# Patient Record
Sex: Female | Born: 1980
Health system: Southern US, Community
[De-identification: ages and names within clinical notes are randomized; demographics above are authoritative.]

## PROBLEM LIST (undated history)

## (undated) DIAGNOSIS — D649 Anemia, unspecified: Secondary | ICD-10-CM

## (undated) DIAGNOSIS — K5090 Crohn's disease, unspecified, without complications: Secondary | ICD-10-CM

## (undated) DIAGNOSIS — L409 Psoriasis, unspecified: Secondary | ICD-10-CM

## (undated) DIAGNOSIS — L732 Hidradenitis suppurativa: Secondary | ICD-10-CM

## (undated) DIAGNOSIS — K529 Noninfective gastroenteritis and colitis, unspecified: Secondary | ICD-10-CM

## (undated) DIAGNOSIS — J42 Unspecified chronic bronchitis: Secondary | ICD-10-CM

## (undated) HISTORY — PX: ABDOMINAL SURGERY: SHX537

## (undated) HISTORY — PX: NEPHROSTOMY: SHX1014

## (undated) HISTORY — PX: ILEOSTOMY: SHX1783

## (undated) HISTORY — PX: OTHER SURGICAL HISTORY: SHX169

---

## 2006-06-07 HISTORY — PX: COLECTOMY WITH COLOSTOMY CREATION/HARTMANN PROCEDURE: SHX6598

## 2007-06-08 HISTORY — PX: ILEOSTOMY: SHX1783

## 2011-07-21 ENCOUNTER — Encounter (HOSPITAL_BASED_OUTPATIENT_CLINIC_OR_DEPARTMENT_OTHER): Payer: Self-pay | Admitting: Emergency Medicine

## 2011-07-21 ENCOUNTER — Emergency Department (HOSPITAL_BASED_OUTPATIENT_CLINIC_OR_DEPARTMENT_OTHER)
Admission: EM | Admit: 2011-07-21 | Discharge: 2011-07-21 | Disposition: A | Discharge status: Home / Self Care | Payer: Medicare Other | Attending: Emergency Medicine | Admitting: Emergency Medicine

## 2011-07-21 ENCOUNTER — Emergency Department (HOSPITAL_BASED_OUTPATIENT_CLINIC_OR_DEPARTMENT_OTHER): Payer: Medicare Other

## 2011-07-21 DIAGNOSIS — F17200 Nicotine dependence, unspecified, uncomplicated: Secondary | ICD-10-CM | POA: Insufficient documentation

## 2011-07-21 DIAGNOSIS — M25519 Pain in unspecified shoulder: Secondary | ICD-10-CM | POA: Insufficient documentation

## 2011-07-21 HISTORY — DX: Crohn's disease, unspecified, without complications: K50.90

## 2011-07-21 MED ORDER — OXYCODONE-ACETAMINOPHEN 5-325 MG PO TABS
2.0000 | ORAL_TABLET | Freq: Once | ORAL | Status: AC
  Administered 2011-07-21: 2 via ORAL
  Filled 2011-07-21: qty 2
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
## 2011-07-21 NOTE — Discharge Instructions (Signed)
Call Dr. Lazaro Arms office tomorrow for follow up.    Rotator Cuff Injury The rotator cuff is the collective set of muscles and tendons that make up the stabilizing unit of your shoulder. This unit holds in the ball of the humerus (upper arm bone) in the socket of the scapula (shoulder blade). Injuries to this stabilizing unit most commonly come from sports or activities that cause the arm to be moved repeatedly over the head. Examples of this include throwing, weight lifting, swimming, racquet sports, or an injury such as falling on your arm. Chronic (longstanding) irritation of this unit can cause inflammation (soreness), bursitis, and eventual damage to the tendons to the point of rupture (tear). An acute (sudden) injury of the rotator cuff can result in a partial or complete tear. You may need surgery with complete tears. Small or partial rotator cuff tears may be treated conservatively with temporary immobilization, exercises and rest. Physical therapy may be needed. HOME CARE INSTRUCTIONS   Apply ice to the injury for 15 to 20 minutes 3 to 4 times per day for the first 2 days. Put the ice in a plastic bag and place a towel between the bag of ice and your skin.   If you have a shoulder immobilizer (sling and straps), do not remove it for as long as directed by your caregiver or until you see a caregiver for a follow-up examination. If you need to remove it, move your arm as little as possible.   You may want to sleep on several pillows or in a recliner at night to lessen swelling and pain.   Only take over-the-counter or prescription medicines for pain, discomfort, or fever as directed by your caregiver.   Do simple hand squeezing exercises with a soft rubber ball to decrease hand swelling.  SEEK MEDICAL CARE IF:   Pain in your shoulder increases or new pain or numbness develops in your arm, hand, or fingers.   Your hand or fingers are colder than your other hand.  SEEK IMMEDIATE MEDICAL CARE  IF:   Your arm, hand, or fingers are numb or tingling.   Your arm, hand, or fingers are increasingly swollen and painful, or turn white or blue.  Document Released: 05/21/2000 Document Revised: 02/03/2011 Document Reviewed: 05/14/2008 [facility] Patient Information 2012 [place], [place].

## 2011-07-27 ENCOUNTER — Ambulatory Visit (INDEPENDENT_AMBULATORY_CARE_PROVIDER_SITE_OTHER): Payer: Medicare Other | Admitting: Family Medicine

## 2011-07-27 ENCOUNTER — Encounter: Payer: Self-pay | Admitting: Family Medicine

## 2011-07-27 VITALS — BP 98/69 | HR 77 | Temp 98.2°F | Ht 67.0 in | Wt 190.0 lb

## 2011-07-27 DIAGNOSIS — M25519 Pain in unspecified shoulder: Secondary | ICD-10-CM

## 2011-07-27 DIAGNOSIS — M25512 Pain in left shoulder: Secondary | ICD-10-CM

## 2011-07-27 MED ORDER — NITROGLYCERIN 0.2 MG/HR TD PT24: MEDICATED_PATCH | TRANSDERMAL | Status: DC

## 2011-07-27 NOTE — Patient Instructions (Signed)
You have rotator cuff impingement and tendinopathy. Try to avoid painful activities (overhead activities, lifting with extended arm) as much as possible. Aleve 1-2 tabs twice a day with food for pain and inflammation Nitro patches - place 1/4th of a patch outside shoulder (on skin) and change every day.   Vicodin every 6 hours as needed for severe pain (don't take tylenol while you're taking this) - no driving while on this. Subacromial injection may be beneficial to help with pain and to decrease inflammation - you've had two of these already, I don't think it would be a good idea to give you another one. Home exercise program as discussed with theraband and scapular stabilization exercises - these are very important for long term relief even if an injection was given. Start physical therapy IF you are allowed more than 3 visits a year. Follow up with me in 5-6 weeks. If not improving would consider referral for discussion of subacromial decompression, distal clavicle excision, debridement. It's possible the surgeon would repeat your MRI given the old one is over 15 years old.

## 2011-07-29 ENCOUNTER — Encounter: Payer: Self-pay | Admitting: Family Medicine

## 2011-07-29 DIAGNOSIS — M25512 Pain in left shoulder: Secondary | ICD-10-CM | POA: Insufficient documentation
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
## 2011-07-29 NOTE — Assessment & Plan Note (Signed)
Exam consistent with MRI from 3 years ago - doubt she has a new rotator cuff tear, labral pathology (no new injuries).  Has already gone through significant portions of conservative protocol for impingement - cortisone injections x 2, PT, nsaids.  Given home exericse program and will write for PT depending on how many visits she is alloted by her insurance.  Vicodin and aleve prn.  Trial nitro patches for chronic tendinopathy.  If not improving after 6 weeks would then refer to orthopedic surgeon - they may want to repeat her MRI before considering surgical intervention given this was over 3 years ago but I would defer this to them.

## 2011-08-09 DIAGNOSIS — L732 Hidradenitis suppurativa: Secondary | ICD-10-CM | POA: Insufficient documentation

## 2011-08-31 ENCOUNTER — Ambulatory Visit: Payer: Medicare Other | Admitting: Family Medicine

## 2011-09-09 ENCOUNTER — Ambulatory Visit: Payer: Medicare Other | Admitting: Family Medicine

## 2012-02-10 DIAGNOSIS — L409 Psoriasis, unspecified: Secondary | ICD-10-CM | POA: Insufficient documentation

## 2012-02-14 ENCOUNTER — Encounter (HOSPITAL_BASED_OUTPATIENT_CLINIC_OR_DEPARTMENT_OTHER): Payer: Self-pay | Admitting: *Deleted

## 2012-02-14 ENCOUNTER — Emergency Department (HOSPITAL_BASED_OUTPATIENT_CLINIC_OR_DEPARTMENT_OTHER)
Admission: EM | Admit: 2012-02-14 | Discharge: 2012-02-14 | Disposition: A | Discharge status: Home / Self Care | Payer: Medicare Other | Attending: Emergency Medicine | Admitting: Emergency Medicine

## 2012-02-14 DIAGNOSIS — Z8249 Family history of ischemic heart disease and other diseases of the circulatory system: Secondary | ICD-10-CM | POA: Insufficient documentation

## 2012-02-14 DIAGNOSIS — F17200 Nicotine dependence, unspecified, uncomplicated: Secondary | ICD-10-CM | POA: Insufficient documentation

## 2012-02-14 DIAGNOSIS — K6289 Other specified diseases of anus and rectum: Secondary | ICD-10-CM | POA: Insufficient documentation

## 2012-02-14 DIAGNOSIS — Z888 Allergy status to other drugs, medicaments and biological substances status: Secondary | ICD-10-CM | POA: Insufficient documentation

## 2012-02-14 DIAGNOSIS — Z8489 Family history of other specified conditions: Secondary | ICD-10-CM | POA: Insufficient documentation

## 2012-02-14 DIAGNOSIS — Z833 Family history of diabetes mellitus: Secondary | ICD-10-CM | POA: Insufficient documentation

## 2012-02-14 DIAGNOSIS — Z91018 Allergy to other foods: Secondary | ICD-10-CM | POA: Insufficient documentation

## 2012-02-14 LAB — CBC WITH DIFFERENTIAL/PLATELET
Basophils Absolute: 0 10*3/uL (ref 0.0–0.1)
Basophils Relative: 0 % (ref 0–1)
HCT: 37.5 % (ref 36.0–46.0)
Lymphocytes Relative: 27 % (ref 12–46)
MCHC: 34.4 g/dL (ref 30.0–36.0)
Monocytes Absolute: 0.8 10*3/uL (ref 0.1–1.0)
Neutro Abs: 5.7 10*3/uL (ref 1.7–7.7)
Neutrophils Relative %: 62 % (ref 43–77)
Platelets: 310 10*3/uL (ref 150–400)
RDW: 12 % (ref 11.5–15.5)
WBC: 9.2 10*3/uL (ref 4.0–10.5)

## 2012-02-14 LAB — BASIC METABOLIC PANEL
Chloride: 99 mEq/L (ref 96–112)
Creatinine, Ser: 0.6 mg/dL (ref 0.50–1.10)
GFR calc Af Amer: >90 mL/min (ref >90)
Sodium: 138 mEq/L (ref 135–145)

## 2012-02-14 MED ORDER — PREDNISONE 50 MG PO TABS: 50.0000 mg | ORAL_TABLET | Freq: Every day | ORAL | Status: DC

## 2012-02-14 MED ORDER — OXYCODONE-ACETAMINOPHEN 5-325 MG PO TABS: 1.0000 | ORAL_TABLET | ORAL | Status: AC | PRN

## 2012-02-14 MED ORDER — OXYCODONE-ACETAMINOPHEN 5-325 MG PO TABS
1.0000 | ORAL_TABLET | Freq: Once | ORAL | Status: AC
  Administered 2012-02-14: 1 via ORAL
  Filled 2012-02-14 (×2): qty 1

## 2012-02-14 NOTE — ED Notes (Signed)
Pt c/o rectal pain x 1 week

## 2012-02-14 NOTE — ED Provider Notes (Signed)
History   This chart was scribed for Michelle Booze, MD by Sofie Rower. The patient was seen in room MH10/MH10 and the patient's care was started at 3:55PM    CSN: [id number]  Arrival date & time 02/14/12  1437   First MD Initiated Contact with Patient 02/14/12 1555      Chief Complaint  Patient presents with  . Rectal Pain    (Consider location/radiation/quality/duration/timing/severity/associated sxs/prior treatment) The history is provided by the patient and a friend. No language interpreter was used.    Michelle Hill is a 31 y.o. female , with a hx of crohn's disease and ileostomy, who presents to the Emergency Department complaining of  sudden, progressively worsening, rectal pain, onset two weeks ago with associated symptoms of chills, sweats, and nausea. The pt reports she has been experiencing pain, in addition to drainage from her rectum for two weeks, which has progressively become worse, prompting her visit to [facility] today, 02/14/12. The pt describes her rectal drainage as a mucosal consistency, rating her rectal pain at 8/10 at present. Modifying factors include taking ibuprofen which does not provide relief of the rectal pain. The pt has a hx of psoriasis located in the hands, feet, and scalp.  The pt denies any blood present in her rectal drainage.   The pt is a current everyday smoker (smokes 1/2 pack per day), in addition to occasionally drinking alcohol.  PCP is Dr. Donnal Debar at [facility].    Past Medical History  Diagnosis Date  . Crohn's disease     Past Surgical History  Procedure Date  . Ileostomy     Family History  Problem Relation Age of Onset  . Hyperlipidemia Father   . Hypertension Father   . Diabetes Neg Hx   . Heart attack Neg Hx   . Sudden death Neg Hx     History  Substance Use Topics  . Smoking status: Current Everyday Smoker -- 0.5 packs/day    Types: Cigarettes  . Smokeless tobacco: Not on file  . Alcohol Use: No     occ     OB History    Grav Para Term Preterm Abortions TAB SAB Ect Mult Living                  Review of Systems  All other systems reviewed and are negative.    Allergies  Chocolate and Aspirin  Home Medications   Current Outpatient Rx  Name Route Sig Dispense Refill  . ADULT MULTIVITAMIN W/MINERALS CH Oral Take 1 tablet by mouth daily.      BP 106/74  Pulse 89  Temp 98.5 F (36.9 C)  Resp 16  Ht 5\' 7"  (1.702 m)  Wt 192 lb (87.091 kg)  BMI 30.07 kg/m2  SpO2 100%  LMP 01/24/2012  Physical Exam  Nursing note and vitals reviewed. Constitutional: She is oriented to person, place, and time. She appears well-developed and well-nourished.  HENT:  Head: Atraumatic.  Nose: Nose normal.  Eyes: Conjunctivae and EOM are normal. Pupils are equal, round, and reactive to light.  Neck: Normal range of motion. Neck supple.  Cardiovascular: Normal rate, regular rhythm and normal heart sounds.   Pulmonary/Chest: Effort normal and breath sounds normal.  Abdominal: Soft. Bowel sounds are normal.  Genitourinary:       Mild mucoid drainage, no mass or tenderness or bleeding.   Musculoskeletal: Normal range of motion.  Neurological: She is alert and oriented to person, place, and time.  Skin: Skin is warm and dry.       Erythema and scaling on palms and soles consistent with psoriasis.  Psychiatric: She has a normal mood and affect. Her behavior is normal.    ED Course  Procedures (including critical care time)  DIAGNOSTIC STUDIES: Oxygen Saturation is 100% on room air, normal by my interpretation.    COORDINATION OF CARE:    4:08PM- Rectal exam conducted. Chaperone Present.   4:09PM- Treatment plan discussed. Pt agrees with treatment.   Results for orders placed during the hospital encounter of 02/14/12  CBC WITH DIFFERENTIAL      Component Value Range   WBC 9.2  4.0 - 10.5 K/uL   RBC 3.88  3.87 - 5.11 MIL/uL   Hemoglobin 12.9  12.0 - 15.0 g/dL   HCT 16.1  09.6 - 04.5  %   MCV 96.6  78.0 - 100.0 fL   MCH 33.2  26.0 - 34.0 pg   MCHC 34.4  30.0 - 36.0 g/dL   RDW 40.9  81.1 - 91.4 %   Platelets 310  150 - 400 K/uL   Neutrophils Relative 62  43 - 77 %   Neutro Abs 5.7  1.7 - 7.7 K/uL   Lymphocytes Relative 27  12 - 46 %   Lymphs Abs 2.5  0.7 - 4.0 K/uL   Monocytes Relative 9  3 - 12 %   Monocytes Absolute 0.8  0.1 - 1.0 K/uL   Eosinophils Relative 2  0 - 5 %   Eosinophils Absolute 0.2  0.0 - 0.7 K/uL   Basophils Relative 0  0 - 1 %   Basophils Absolute 0.0  0.0 - 0.1 K/uL  BASIC METABOLIC PANEL      Component Value Range   Sodium 138  135 - 145 mEq/L   Potassium 4.2  3.5 - 5.1 mEq/L   Chloride 99  96 - 112 mEq/L   CO2 28  19 - 32 mEq/L   Glucose, Bld 98  70 - 99 mg/dL   BUN 15  6 - 23 mg/dL   Creatinine, Ser 7.82  0.50 - 1.10 mg/dL   Calcium 9.9  8.4 - 95.6 mg/dL   GFR calc non Af Amer >90  >90 mL/min   GFR calc Af Amer >90  >90 mL/min      1. Rectal pain       MDM  Rectal pain with mucoid drainage in patient with known Crohn disease and status post total colectomy. This probably represents a flareup of Crohn's disease in her rectal stump. Laboratory workup is unremarkable, but this is not unexpected with Crohn's disease. She will be placed on moderate dose steroids and referred back to her gastroenterologist for followup.      I personally performed the services described in this documentation, which was scribed in my presence. The recorded information has been reviewed and considered.      Michelle Booze, MD 02/14/12 [phone]

## 2012-04-09 ENCOUNTER — Emergency Department (HOSPITAL_BASED_OUTPATIENT_CLINIC_OR_DEPARTMENT_OTHER): Payer: Medicare Other

## 2012-04-09 ENCOUNTER — Encounter (HOSPITAL_BASED_OUTPATIENT_CLINIC_OR_DEPARTMENT_OTHER): Payer: Self-pay | Admitting: Emergency Medicine

## 2012-04-09 ENCOUNTER — Emergency Department (HOSPITAL_BASED_OUTPATIENT_CLINIC_OR_DEPARTMENT_OTHER)
Admission: EM | Admit: 2012-04-09 | Discharge: 2012-04-09 | Disposition: A | Discharge status: Home / Self Care | Payer: Medicare Other | Attending: Emergency Medicine | Admitting: Emergency Medicine

## 2012-04-09 DIAGNOSIS — Y929 Unspecified place or not applicable: Secondary | ICD-10-CM | POA: Insufficient documentation

## 2012-04-09 DIAGNOSIS — K5090 Crohn's disease, unspecified, without complications: Secondary | ICD-10-CM | POA: Insufficient documentation

## 2012-04-09 DIAGNOSIS — Y939 Activity, unspecified: Secondary | ICD-10-CM | POA: Insufficient documentation

## 2012-04-09 DIAGNOSIS — F17200 Nicotine dependence, unspecified, uncomplicated: Secondary | ICD-10-CM | POA: Insufficient documentation

## 2012-04-09 DIAGNOSIS — Z872 Personal history of diseases of the skin and subcutaneous tissue: Secondary | ICD-10-CM | POA: Insufficient documentation

## 2012-04-09 DIAGNOSIS — W19XXXA Unspecified fall, initial encounter: Secondary | ICD-10-CM | POA: Insufficient documentation

## 2012-04-09 DIAGNOSIS — Z79899 Other long term (current) drug therapy: Secondary | ICD-10-CM | POA: Insufficient documentation

## 2012-04-09 DIAGNOSIS — S93409A Sprain of unspecified ligament of unspecified ankle, initial encounter: Secondary | ICD-10-CM | POA: Insufficient documentation

## 2012-04-09 HISTORY — DX: Psoriasis, unspecified: L40.9

## 2012-04-09 MED ORDER — OXYCODONE-ACETAMINOPHEN 5-325 MG PO TABS
2.0000 | ORAL_TABLET | Freq: Once | ORAL | Status: AC
  Administered 2012-04-09: 2 via ORAL
  Filled 2012-04-09 (×2): qty 2

## 2012-04-09 MED ORDER — OXYCODONE-ACETAMINOPHEN 5-325 MG PO TABS: 2.0000 | ORAL_TABLET | ORAL | Status: DC | PRN

## 2012-04-09 NOTE — ED Notes (Signed)
Pt has her own crutches

## 2012-04-09 NOTE — ED Notes (Signed)
Pt fell Saturday and injured right ankle.  Pt has bruising and swelling.  Painful to touch, worse with ambulation.  Good CMS.

## 2012-04-09 NOTE — ED Provider Notes (Signed)
History   This chart was scribed for Michelle Bucco, MD by Albertha Ghee Rifaie. This patient was seen in room MH01/MH01 and the patient's care was started at 1720.   CSN: [id number]  Arrival date & time 04/09/12  1629   First MD Initiated Contact with Patient 04/09/12 1720      Chief Complaint  Patient presents with  . Ankle Injury    (Consider location/radiation/quality/duration/timing/severity/associated sxs/prior treatment) The history is provided by the patient. No language interpreter was used.    Michelle Hill is a 31 y.o. female who presents to the Emergency Department complaining of gradually worsening, constant, non-radiating, moderate to severe right ankle and foot pain onset one day ago. The pain is attributed to a fall. There is associated bruising and swelling to the ankle. The pain is worsened by applying pressure and with ambulation. Patient has taken ibuprofen for pain relief. Patient has a history of psoriasis to the right foot. She denies any other injuries, back pain, and neck pain. She also denies fever, chills, and nausea. Pt is a current everyday smoker and occasional alcohol user.   Past Medical History  Diagnosis Date  . Crohn's disease   . Psoriasis     Past Surgical History  Procedure Date  . Ileostomy     Family History  Problem Relation Age of Onset  . Hyperlipidemia Father   . Hypertension Father   . Diabetes Neg Hx   . Heart attack Neg Hx   . Sudden death Neg Hx     History  Substance Use Topics  . Smoking status: Current Every Day Smoker -- 0.5 packs/day    Types: Cigarettes  . Smokeless tobacco: Not on file  . Alcohol Use: No     Comment: occ    OB History    Grav Para Term Preterm Abortions TAB SAB Ect Mult Living                  Review of Systems  Constitutional: Negative for fever, chills, diaphoresis and fatigue.  HENT: Negative for congestion, rhinorrhea and sneezing.   Eyes: Negative.   Respiratory: Negative for  cough, chest tightness and shortness of breath.   Cardiovascular: Negative for chest pain and leg swelling.  Gastrointestinal: Negative for nausea, vomiting, abdominal pain, diarrhea and blood in stool.  Genitourinary: Negative for frequency, hematuria, flank pain and difficulty urinating.  Musculoskeletal: Positive for arthralgias. Negative for back pain.  Skin: Negative for rash.  Neurological: Negative for dizziness, speech difficulty, weakness, numbness and headaches.    Allergies  Chocolate and Aspirin  Home Medications   Current Outpatient Rx  Name  Route  Sig  Dispense  Refill  . INFLIXIMAB 100 MG IV SOLR   Intravenous   Inject 100 mg into the vein every 8 (eight) weeks.         . ADULT MULTIVITAMIN W/MINERALS CH   Oral   Take 1 tablet by mouth daily.         . OXYCODONE-ACETAMINOPHEN 5-325 MG PO TABS   Oral   Take 2 tablets by mouth every 4 (four) hours as needed for pain.   15 tablet   0     BP 114/82  Pulse 67  Temp 98.3 F (36.8 C) (Oral)  Resp 16  SpO2 96%  LMP 03/25/2012  Physical Exam  Constitutional: She is oriented to person, place, and time. She appears well-developed and well-nourished.  HENT:  Head: Normocephalic and atraumatic.  Neck: Normal range  of motion. Neck supple.       No pain in neck or back  Musculoskeletal: Normal range of motion. She exhibits tenderness.       Right ankle: tenderness. Lateral malleolus tenderness found. No proximal fibula tenderness found.       Right foot: She exhibits tenderness.       Ecchymosis along the lateral malleolus along lateral right foot.  Pain along lateral malleolus and 4th and 5th metatarsals. No pain to proximal fibula. Neurovascularly intact.   Neurological: She is alert and oriented to person, place, and time.    ED Course  Procedures (including critical care time)  DIAGNOSTIC STUDIES: Oxygen Saturation is 96% on room air, adequate by my interpretation.    COORDINATION OF CARE: 6:31  PMDiscussed treatment plan with pt at bedside and pt agreed to plan.     Dg Ankle Complete Right  04/09/2012  *RADIOLOGY REPORT*  Clinical Data: Larey Seat.  Left ankle pain.  RIGHT ANKLE - COMPLETE 3+ VIEW  Comparison: None  Findings: The ankle mortise is maintained.  No acute ankle fractures identified.  The visualized mid and hind foot bony structures are intact.  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  There is fairly significant soft tissue swelling around the ankle.  IMPRESSION: No acute fracture.   Original Report Authenticated By: Rudie Meyer, M.D.    Dg Foot Complete Right  04/09/2012  *RADIOLOGY REPORT*  Clinical Data: Right foot injury.  RIGHT FOOT COMPLETE - 3+ VIEW  Comparison: None  Findings: The joint spaces are maintained.  No acute fracture.  IMPRESSION: No acute bony findings.   Original Report Authenticated By: Rudie Meyer, M.D.       1. Ankle sprain       MDM  No fracture noted.  Pt placed in air splint, has crutches.  Advised ice/elevation.  rx for percocet.  Advised to f/u with ortho if symptoms not improved.      I personally performed the services described in this documentation, which was scribed in my presence.  The recorded information has been reviewed and considered.    Michelle Bucco, MD 04/09/12 [phone]

## 2012-04-09 NOTE — ED Notes (Signed)
Ice pack given to the patient for her ankle.

## 2012-09-20 ENCOUNTER — Emergency Department (HOSPITAL_BASED_OUTPATIENT_CLINIC_OR_DEPARTMENT_OTHER): Payer: Medicare Other

## 2012-09-20 ENCOUNTER — Encounter (HOSPITAL_BASED_OUTPATIENT_CLINIC_OR_DEPARTMENT_OTHER): Payer: Self-pay | Admitting: *Deleted

## 2012-09-20 ENCOUNTER — Emergency Department (HOSPITAL_BASED_OUTPATIENT_CLINIC_OR_DEPARTMENT_OTHER)
Admission: EM | Admit: 2012-09-20 | Discharge: 2012-09-20 | Disposition: A | Discharge status: Home / Self Care | Payer: Medicare Other | Attending: Emergency Medicine | Admitting: Emergency Medicine

## 2012-09-20 DIAGNOSIS — F17200 Nicotine dependence, unspecified, uncomplicated: Secondary | ICD-10-CM | POA: Insufficient documentation

## 2012-09-20 DIAGNOSIS — R509 Fever, unspecified: Secondary | ICD-10-CM

## 2012-09-20 DIAGNOSIS — IMO0001 Reserved for inherently not codable concepts without codable children: Secondary | ICD-10-CM | POA: Insufficient documentation

## 2012-09-20 DIAGNOSIS — Z79899 Other long term (current) drug therapy: Secondary | ICD-10-CM | POA: Insufficient documentation

## 2012-09-20 DIAGNOSIS — K5090 Crohn's disease, unspecified, without complications: Secondary | ICD-10-CM | POA: Insufficient documentation

## 2012-09-20 DIAGNOSIS — Z932 Ileostomy status: Secondary | ICD-10-CM | POA: Insufficient documentation

## 2012-09-20 DIAGNOSIS — Z3202 Encounter for pregnancy test, result negative: Secondary | ICD-10-CM | POA: Insufficient documentation

## 2012-09-20 DIAGNOSIS — M791 Myalgia, unspecified site: Secondary | ICD-10-CM

## 2012-09-20 LAB — CBC WITH DIFFERENTIAL/PLATELET
Basophils Absolute: 0 10*3/uL (ref 0.0–0.1)
Basophils Relative: 0 % (ref 0–1)
Eosinophils Absolute: 0.1 10*3/uL (ref 0.0–0.7)
Eosinophils Relative: 1 % (ref 0–5)
HCT: 39.6 % (ref 36.0–46.0)
Hemoglobin: 13.6 g/dL (ref 12.0–15.0)
Lymphocytes Relative: 7 % — ABNORMAL LOW (ref 12–46)
Lymphs Abs: 1.4 10*3/uL (ref 0.7–4.0)
MCH: 33.7 pg (ref 26.0–34.0)
MCHC: 34.3 g/dL (ref 30.0–36.0)
MCV: 98 fL (ref 78.0–100.0)
Monocytes Absolute: 1 10*3/uL (ref 0.1–1.0)
Monocytes Relative: 5 % (ref 3–12)
Neutro Abs: 18 10*3/uL — ABNORMAL HIGH (ref 1.7–7.7)
Neutrophils Relative %: 88 % — ABNORMAL HIGH (ref 43–77)
Platelets: 328 10*3/uL (ref 150–400)
RBC: 4.04 MIL/uL (ref 3.87–5.11)
RDW: 14 % (ref 11.5–15.5)
WBC: 20.5 10*3/uL — ABNORMAL HIGH (ref 4.0–10.5)

## 2012-09-20 LAB — URINE MICROSCOPIC-ADD ON

## 2012-09-20 LAB — BASIC METABOLIC PANEL
BUN: 11 mg/dL (ref 6–23)
CO2: 24 mEq/L (ref 19–32)
Calcium: 10.1 mg/dL (ref 8.4–10.5)
Chloride: 103 mEq/L (ref 96–112)
Creatinine, Ser: 0.6 mg/dL (ref 0.50–1.10)
GFR calc Af Amer: >90 mL/min (ref >90)
GFR calc non Af Amer: >90 mL/min (ref >90)
Glucose, Bld: 101 mg/dL — ABNORMAL HIGH (ref 70–99)
Potassium: 3.6 mEq/L (ref 3.5–5.1)
Sodium: 138 mEq/L (ref 135–145)

## 2012-09-20 LAB — URINALYSIS, ROUTINE W REFLEX MICROSCOPIC
Bilirubin Urine: NEGATIVE
Glucose, UA: NEGATIVE mg/dL
Ketones, ur: NEGATIVE mg/dL
Leukocytes, UA: NEGATIVE
Nitrite: NEGATIVE
Protein, ur: NEGATIVE mg/dL
Specific Gravity, Urine: 1.028 (ref 1.005–1.030)
Urobilinogen, UA: 0.2 mg/dL (ref 0.0–1.0)
pH: 6 (ref 5.0–8.0)

## 2012-09-20 LAB — PREGNANCY, URINE: Preg Test, Ur: NEGATIVE

## 2012-09-20 MED ORDER — SODIUM CHLORIDE 0.9 % IV BOLUS (SEPSIS)
1000.0000 mL | Freq: Once | INTRAVENOUS | Status: AC
  Administered 2012-09-20: 1000 mL via INTRAVENOUS

## 2012-09-20 NOTE — ED Notes (Signed)
Pt states that she woke up this morning with fever and body aches

## 2012-09-23 NOTE — ED Provider Notes (Signed)
History    32 year old female with fever and bodyaches. Symptom onset when she woke up this morning. She was in usual state of health when she was asleep last night. Diffuse body aches. No cough. No shortness of breath. No urinary complaints. No vomiting. Past medical history significant for Crohn's disease. She does receive Remicade injections every 8 weeks. Last one several weeks ago.   CSN: [id number]  Arrival date & time 09/20/12  0929   First MD Initiated Contact with Patient 09/20/12 [phone]      Chief Complaint  Patient presents with  . Generalized Body Aches    (Consider location/radiation/quality/duration/timing/severity/associated sxs/prior treatment) HPI  Past Medical History  Diagnosis Date  . Crohn's disease   . Psoriasis     Past Surgical History  Procedure Laterality Date  . Ileostomy      Family History  Problem Relation Age of Onset  . Hyperlipidemia Father   . Hypertension Father   . Diabetes Neg Hx   . Heart attack Neg Hx   . Sudden death Neg Hx     History  Substance Use Topics  . Smoking status: Current Every Day Smoker -- 0.50 packs/day    Types: Cigarettes  . Smokeless tobacco: Not on file  . Alcohol Use: No     Comment: occ    OB History   Grav Para Term Preterm Abortions TAB SAB Ect Mult Living                  Review of Systems  All systems reviewed and negative, other than as noted in HPI.  Allergies  Chocolate and Aspirin  Home Medications   Current Outpatient Rx  Name  Route  Sig  Dispense  Refill  . inFLIXimab (REMICADE) 100 MG injection   Intravenous   Inject 100 mg into the vein every 8 (eight) weeks.         . Multiple Vitamin (MULITIVITAMIN WITH MINERALS) TABS   Oral   Take 1 tablet by mouth daily.         Marland Kitchen oxyCODONE-acetaminophen (PERCOCET/ROXICET) 5-325 MG per tablet   Oral   Take 2 tablets by mouth every 4 (four) hours as needed for pain.   15 tablet   0     BP 108/73  Pulse 116  Temp(Src) 98.3 F  (36.8 C) (Oral)  Resp 18  Ht 5\' 7"  (1.702 m)  Wt 185 lb (83.915 kg)  BMI 28.97 kg/m2  SpO2 99%  LMP 08/31/2012  Physical Exam  Nursing note and vitals reviewed. Constitutional: She appears well-developed and well-nourished. No distress.  HENT:  Head: Normocephalic and atraumatic.  Eyes: Conjunctivae are normal. Right eye exhibits no discharge. Left eye exhibits no discharge.  Neck: Neck supple.  Cardiovascular: Normal rate, regular rhythm and normal heart sounds.  Exam reveals no gallop and no friction rub.   No murmur heard. Pulmonary/Chest: Effort normal and breath sounds normal. No respiratory distress.  Abdominal: Soft. She exhibits no distension. There is no tenderness. There is no rebound.  ileostomy  Musculoskeletal: She exhibits no edema and no tenderness.  Neurological: She is alert.  Skin: Skin is warm and dry.  Psychiatric: She has a normal mood and affect. Her behavior is normal. Thought content normal.    ED Course  Procedures (including critical care time)  Labs Reviewed  CBC WITH DIFFERENTIAL - Abnormal; Notable for the following:    WBC 20.5 (*)    Neutrophils Relative 88 (*)    Neutro  Abs 18.0 (*)    Lymphocytes Relative 7 (*)    All other components within normal limits  BASIC METABOLIC PANEL - Abnormal; Notable for the following:    Glucose, Bld 101 (*)    All other components within normal limits  URINALYSIS, ROUTINE W REFLEX MICROSCOPIC - Abnormal; Notable for the following:    APPearance CLOUDY (*)    Hgb urine dipstick TRACE (*)    All other components within normal limits  URINE MICROSCOPIC-ADD ON - Abnormal; Notable for the following:    Squamous Epithelial / LPF MANY (*)    Bacteria, UA MANY (*)    All other components within normal limits  PREGNANCY, URINE   No results found.   1. Fever   2. Myalgia       MDM  31yF with fever and myalgias. Suspect viral illness. Leukocytosis, but this is nonspecific. No obvious source based on  HPI, exam or w/u. Bacteria on UA, but also with many squam cells. Culture sent. Deferred from tx given lack of urinary symptoms. She is HD stable. Some pause for concern for with remicade infusions, but I feel she is safe for DC at this time.         Raeford Razor, MD 09/23/12 [phone]

## 2012-11-10 ENCOUNTER — Emergency Department (HOSPITAL_BASED_OUTPATIENT_CLINIC_OR_DEPARTMENT_OTHER): Payer: PRIVATE HEALTH INSURANCE

## 2012-11-10 ENCOUNTER — Emergency Department (HOSPITAL_BASED_OUTPATIENT_CLINIC_OR_DEPARTMENT_OTHER)
Admission: EM | Admit: 2012-11-10 | Discharge: 2012-11-10 | Disposition: A | Discharge status: Home / Self Care | Payer: PRIVATE HEALTH INSURANCE | Attending: Emergency Medicine | Admitting: Emergency Medicine

## 2012-11-10 ENCOUNTER — Encounter (HOSPITAL_BASED_OUTPATIENT_CLINIC_OR_DEPARTMENT_OTHER): Payer: Self-pay | Admitting: *Deleted

## 2012-11-10 DIAGNOSIS — M25569 Pain in unspecified knee: Secondary | ICD-10-CM | POA: Insufficient documentation

## 2012-11-10 DIAGNOSIS — K5090 Crohn's disease, unspecified, without complications: Secondary | ICD-10-CM | POA: Insufficient documentation

## 2012-11-10 DIAGNOSIS — F17200 Nicotine dependence, unspecified, uncomplicated: Secondary | ICD-10-CM | POA: Insufficient documentation

## 2012-11-10 DIAGNOSIS — Z872 Personal history of diseases of the skin and subcutaneous tissue: Secondary | ICD-10-CM | POA: Insufficient documentation

## 2012-11-10 DIAGNOSIS — M25469 Effusion, unspecified knee: Secondary | ICD-10-CM | POA: Insufficient documentation

## 2012-11-10 DIAGNOSIS — Z79899 Other long term (current) drug therapy: Secondary | ICD-10-CM | POA: Insufficient documentation

## 2012-11-10 DIAGNOSIS — M2550 Pain in unspecified joint: Secondary | ICD-10-CM

## 2012-11-10 DIAGNOSIS — Z87828 Personal history of other (healed) physical injury and trauma: Secondary | ICD-10-CM | POA: Insufficient documentation

## 2012-11-10 MED ORDER — TRAMADOL HCL 50 MG PO TABS: 50.0000 mg | ORAL_TABLET | Freq: Four times a day (QID) | ORAL | Status: DC | PRN

## 2012-11-10 MED ORDER — IBUPROFEN 800 MG PO TABS: 800.0000 mg | ORAL_TABLET | Freq: Three times a day (TID) | ORAL | Status: DC

## 2012-11-10 NOTE — ED Provider Notes (Signed)
History     CSN: [id number]  Arrival date & time 11/10/12  2150   First MD Initiated Contact with Patient 11/10/12 2229      Chief Complaint  Patient presents with  . Ankle Injury    (Consider location/radiation/quality/duration/timing/severity/associated sxs/prior treatment) HPI Comments: Patient presents to the ER with complaints of pain and swelling of the right ankle. Patient denies any recent injury, but does report that she had an injury several months ago and was told she tore ligaments. She was told to follow up with orthopedics, but has not. Patient complaining of increased pain and swelling today, worse with walking.  Patient is a 32 y.o. female presenting with lower extremity injury.  Ankle Injury    Past Medical History  Diagnosis Date  . Crohn's disease   . Psoriasis     Past Surgical History  Procedure Laterality Date  . Ileostomy      Family History  Problem Relation Age of Onset  . Hyperlipidemia Father   . Hypertension Father   . Diabetes Neg Hx   . Heart attack Neg Hx   . Sudden death Neg Hx     History  Substance Use Topics  . Smoking status: Current Every Day Smoker -- 0.50 packs/day    Types: Cigarettes  . Smokeless tobacco: Not on file  . Alcohol Use: No     Comment: occ    OB History   Grav Para Term Preterm Abortions TAB SAB Ect Mult Living                  Review of Systems  Musculoskeletal: Positive for arthralgias.    Allergies  Chocolate and Aspirin  Home Medications   Current Outpatient Rx  Name  Route  Sig  Dispense  Refill  . inFLIXimab (REMICADE) 100 MG injection   Intravenous   Inject 100 mg into the vein every 8 (eight) weeks.         . Multiple Vitamin (MULITIVITAMIN WITH MINERALS) TABS   Oral   Take 1 tablet by mouth daily.         Marland Kitchen oxyCODONE-acetaminophen (PERCOCET/ROXICET) 5-325 MG per tablet   Oral   Take 2 tablets by mouth every 4 (four) hours as needed for pain.   15 tablet   0     BP  109/81  Pulse 92  Temp(Src) 99 F (37.2 C) (Oral)  Resp 20  Wt 195 lb (88.451 kg)  BMI 30.53 kg/m2  SpO2 100%  LMP 10/31/2012  Physical Exam  Musculoskeletal:       Right ankle: She exhibits swelling. She exhibits normal range of motion, no ecchymosis and no deformity. Tenderness. Lateral malleolus tenderness found.    ED Course  Procedures (including critical care time)  Labs Reviewed - No data to display Dg Ankle Complete Right  11/10/2012   *RADIOLOGY REPORT*  Clinical Data: Right ankle pain; remote ankle injury.  RIGHT ANKLE - COMPLETE 3+ VIEW  Comparison: Right ankle radiographs performed 04/09/2012  Findings: There is no evidence of fracture or dislocation.  The ankle mortise is intact; the interosseous space is within normal limits.  No talar tilt or subluxation is seen.  A small bone infarct is noted within the distal tibial diaphysis.  An os peroneum is seen.  The joint spaces are preserved.  No significant soft tissue abnormalities are seen.  IMPRESSION:  1.  No evidence of fracture or dislocation. 2.  Os peroneum noted.   Original Report Authenticated By:  Tonia Ghent, M.D.     Diagnosis: Ankle pain    MDM  History of pain and swelling of her ankle. She had a recent injury, did not follow orthopedics. X-ray today is unremarkable. Dental injury. No sign of overlying infection.        Michelle Crease, MD 11/10/12 [phone]

## 2012-11-10 NOTE — ED Notes (Signed)
Right ankle pain. No known injury. Was seen in November for same.

## 2012-11-20 ENCOUNTER — Emergency Department (HOSPITAL_BASED_OUTPATIENT_CLINIC_OR_DEPARTMENT_OTHER)
Admission: EM | Admit: 2012-11-20 | Discharge: 2012-11-20 | Disposition: A | Discharge status: Home / Self Care | Payer: PRIVATE HEALTH INSURANCE | Attending: Emergency Medicine | Admitting: Emergency Medicine

## 2012-11-20 ENCOUNTER — Encounter (HOSPITAL_BASED_OUTPATIENT_CLINIC_OR_DEPARTMENT_OTHER): Payer: Self-pay | Admitting: *Deleted

## 2012-11-20 DIAGNOSIS — L408 Other psoriasis: Secondary | ICD-10-CM | POA: Insufficient documentation

## 2012-11-20 DIAGNOSIS — F17200 Nicotine dependence, unspecified, uncomplicated: Secondary | ICD-10-CM | POA: Insufficient documentation

## 2012-11-20 DIAGNOSIS — L02219 Cutaneous abscess of trunk, unspecified: Secondary | ICD-10-CM | POA: Insufficient documentation

## 2012-11-20 DIAGNOSIS — K5090 Crohn's disease, unspecified, without complications: Secondary | ICD-10-CM | POA: Insufficient documentation

## 2012-11-20 DIAGNOSIS — L03319 Cellulitis of trunk, unspecified: Secondary | ICD-10-CM | POA: Insufficient documentation

## 2012-11-20 DIAGNOSIS — L0291 Cutaneous abscess, unspecified: Secondary | ICD-10-CM

## 2012-11-20 DIAGNOSIS — L732 Hidradenitis suppurativa: Secondary | ICD-10-CM

## 2012-11-20 MED ORDER — OXYCODONE-ACETAMINOPHEN 5-325 MG PO TABS
1.0000 | ORAL_TABLET | Freq: Once | ORAL | Status: AC
  Administered 2012-11-20: 1 via ORAL
  Filled 2012-11-20 (×2): qty 1

## 2012-11-20 MED ORDER — OXYCODONE-ACETAMINOPHEN 5-325 MG PO TABS: 1.0000 | ORAL_TABLET | ORAL | Status: DC | PRN

## 2012-11-20 MED ORDER — SULFAMETHOXAZOLE-TRIMETHOPRIM 800-160 MG PO TABS: 1.0000 | ORAL_TABLET | Freq: Two times a day (BID) | ORAL | Status: DC

## 2012-11-20 NOTE — ED Notes (Signed)
Patient asked to change into gown. 

## 2012-11-20 NOTE — ED Provider Notes (Signed)
Medical screening examination/treatment/procedure(s) were performed by non-physician practitioner and as supervising physician I was immediately available for consultation/collaboration.   Glynn Octave, MD 11/20/12 1806

## 2012-11-20 NOTE — ED Provider Notes (Signed)
History     CSN: [id number]  Arrival date & time 11/20/12  1512   First MD Initiated Contact with Patient 11/20/12 1532      Chief Complaint  Patient presents with  . Abscess    (Consider location/radiation/quality/duration/timing/severity/associated sxs/prior treatment) Patient is a 32 y.o. female presenting with abscess. The history is provided by the patient.  Abscess  Pt presents to the ED for non-draning abscess.  Over past 2 days abscess has become increasingly painful, erythematous, and TTP.  She tried to squeeze it herself in the shower but no fluid was expelled.  Pt has hx of hidradenitis and has recurrent abscesses, usually of the genital region.  Pt states abscesses usually come on after her menstrual cycle.  No recent fevers, sweats, or chills.  Has taken OTC pain meds for her sx with little relief.  Past Medical History  Diagnosis Date  . Crohn's disease   . Psoriasis     Past Surgical History  Procedure Laterality Date  . Ileostomy      Family History  Problem Relation Age of Onset  . Hyperlipidemia Father   . Hypertension Father   . Diabetes Neg Hx   . Heart attack Neg Hx   . Sudden death Neg Hx     History  Substance Use Topics  . Smoking status: Current Every Day Smoker -- 0.50 packs/day    Types: Cigarettes  . Smokeless tobacco: Not on file  . Alcohol Use: No     Comment: occ    OB History   Grav Para Term Preterm Abortions TAB SAB Ect Mult Living                  Review of Systems  Skin:       abscess  All other systems reviewed and are negative.    Allergies  Chocolate and Aspirin  Home Medications   Current Outpatient Rx  Name  Route  Sig  Dispense  Refill  . ibuprofen (ADVIL,MOTRIN) 800 MG tablet   Oral   Take 1 tablet (800 mg total) by mouth 3 (three) times daily.   21 tablet   0   . inFLIXimab (REMICADE) 100 MG injection   Intravenous   Inject 100 mg into the vein every 8 (eight) weeks.         . Multiple Vitamin  (MULITIVITAMIN WITH MINERALS) TABS   Oral   Take 1 tablet by mouth daily.         Marland Kitchen oxyCODONE-acetaminophen (PERCOCET/ROXICET) 5-325 MG per tablet   Oral   Take 2 tablets by mouth every 4 (four) hours as needed for pain.   15 tablet   0   . traMADol (ULTRAM) 50 MG tablet   Oral   Take 1 tablet (50 mg total) by mouth every 6 (six) hours as needed for pain.   15 tablet   0     BP 112/68  Pulse 111  Temp(Src) 98.5 F (36.9 C) (Oral)  Resp 16  Ht 5\' 7"  (1.702 m)  Wt 195 lb (88.451 kg)  BMI 30.53 kg/m2  SpO2 100%  LMP 10/31/2012  Physical Exam  Nursing note and vitals reviewed. Constitutional: She is oriented to person, place, and time. She appears well-developed and well-nourished.  HENT:  Head: Normocephalic and atraumatic.  Eyes: Conjunctivae and EOM are normal.  Neck: Normal range of motion. Neck supple.  Cardiovascular: Normal rate, regular rhythm and normal heart sounds.   Pulmonary/Chest: Effort normal and breath  sounds normal.  Genitourinary:    There is no tenderness or lesion on the right labia. There is no tenderness or lesion on the left labia.  Musculoskeletal: Normal range of motion.  Neurological: She is alert and oriented to person, place, and time.  Skin: Skin is warm and dry.  Psychiatric: She has a normal mood and affect. Her speech is normal.    ED Course  Procedures (including critical care time)  INCISION AND DRAINAGE Performed by: Garlon Hatchet Consent: Verbal consent obtained. Risks and benefits: risks, benefits and alternatives were discussed Type: abscess  Body area: mons pubis  Anesthesia: local infiltration  Incision was made with a scalpel.  Local anesthetic: lidocaine 2% without epinephrine  Anesthetic total: 3 ml  Complexity: complex Blunt dissection to break up loculations  Drainage: purulent  Drainage amount: small  Packing material: 1/4 in iodoform gauze  Patient tolerance: Patient tolerated the procedure  well with no immediate complications.     Labs Reviewed - No data to display No results found.   1. Abscess   2. Hidradenitis       MDM   I&D as above, pt tolerated procedure well.  Rx bactrim and percocet.  FU with PCP in 2-3 days for wound check and packing removal.  Discussed plan with pt, she agreed.  Return precautions advised.       Garlon Hatchet, PA-C 11/20/12 1643

## 2012-11-20 NOTE — ED Notes (Signed)
Pt c/o abscess to right genital area  X 2 days

## 2012-12-21 ENCOUNTER — Emergency Department (HOSPITAL_BASED_OUTPATIENT_CLINIC_OR_DEPARTMENT_OTHER)
Admission: EM | Admit: 2012-12-21 | Discharge: 2012-12-22 | Disposition: A | Discharge status: Home / Self Care | Payer: PRIVATE HEALTH INSURANCE | Attending: Emergency Medicine | Admitting: Emergency Medicine

## 2012-12-21 ENCOUNTER — Encounter (HOSPITAL_BASED_OUTPATIENT_CLINIC_OR_DEPARTMENT_OTHER): Payer: Self-pay | Admitting: *Deleted

## 2012-12-21 DIAGNOSIS — K2900 Acute gastritis without bleeding: Secondary | ICD-10-CM | POA: Insufficient documentation

## 2012-12-21 DIAGNOSIS — R110 Nausea: Secondary | ICD-10-CM | POA: Insufficient documentation

## 2012-12-21 DIAGNOSIS — Z3202 Encounter for pregnancy test, result negative: Secondary | ICD-10-CM | POA: Insufficient documentation

## 2012-12-21 DIAGNOSIS — R1012 Left upper quadrant pain: Secondary | ICD-10-CM | POA: Insufficient documentation

## 2012-12-21 DIAGNOSIS — F17200 Nicotine dependence, unspecified, uncomplicated: Secondary | ICD-10-CM | POA: Insufficient documentation

## 2012-12-21 DIAGNOSIS — K2970 Gastritis, unspecified, without bleeding: Secondary | ICD-10-CM

## 2012-12-21 DIAGNOSIS — Z79899 Other long term (current) drug therapy: Secondary | ICD-10-CM | POA: Insufficient documentation

## 2012-12-21 DIAGNOSIS — Z872 Personal history of diseases of the skin and subcutaneous tissue: Secondary | ICD-10-CM | POA: Insufficient documentation

## 2012-12-21 DIAGNOSIS — Z8719 Personal history of other diseases of the digestive system: Secondary | ICD-10-CM | POA: Insufficient documentation

## 2012-12-21 DIAGNOSIS — N390 Urinary tract infection, site not specified: Secondary | ICD-10-CM | POA: Insufficient documentation

## 2012-12-21 LAB — URINALYSIS, ROUTINE W REFLEX MICROSCOPIC
Nitrite: POSITIVE — AB
Protein, ur: NEGATIVE mg/dL
Urobilinogen, UA: 0.2 mg/dL (ref 0.0–1.0)

## 2012-12-21 LAB — PREGNANCY, URINE: Preg Test, Ur: NEGATIVE

## 2012-12-21 LAB — URINE MICROSCOPIC-ADD ON

## 2012-12-21 MED ORDER — SODIUM CHLORIDE 0.9 % IV SOLN
INTRAVENOUS | Status: DC
  Administered 2012-12-21: via INTRAVENOUS

## 2012-12-21 NOTE — ED Notes (Signed)
Left upper quandrant abdominal x 3 days. She has an ileostomy. Has been taking medication for gas and Ibuprofen for the pain with no relief. States when she lays down she gets pain in her chest into her left arm and her lower back. Feels like a knot in her left upper abdomen. Not having normal bowel movements in her colostomy bag. Nausea.

## 2012-12-22 ENCOUNTER — Emergency Department (HOSPITAL_BASED_OUTPATIENT_CLINIC_OR_DEPARTMENT_OTHER): Payer: PRIVATE HEALTH INSURANCE

## 2012-12-22 LAB — CBC WITH DIFFERENTIAL/PLATELET
Basophils Absolute: 0 10*3/uL (ref 0.0–0.1)
Basophils Relative: 0 % (ref 0–1)
HCT: 37.3 % (ref 36.0–46.0)
MCHC: 34.3 g/dL (ref 30.0–36.0)
Monocytes Absolute: 0.6 10*3/uL (ref 0.1–1.0)
Neutro Abs: 5.4 10*3/uL (ref 1.7–7.7)
RDW: 12.6 % (ref 11.5–15.5)

## 2012-12-22 LAB — COMPREHENSIVE METABOLIC PANEL
AST: 21 U/L (ref 0–37)
Albumin: 3.8 g/dL (ref 3.5–5.2)
Calcium: 10 mg/dL (ref 8.4–10.5)
Chloride: 95 mEq/L — ABNORMAL LOW (ref 96–112)
Creatinine, Ser: 0.7 mg/dL (ref 0.50–1.10)
Total Bilirubin: 0.1 mg/dL — ABNORMAL LOW (ref 0.3–1.2)

## 2012-12-22 MED ORDER — PANTOPRAZOLE SODIUM 40 MG IV SOLR
40.0000 mg | Freq: Once | INTRAVENOUS | Status: AC
  Administered 2012-12-22: 40 mg via INTRAVENOUS
  Filled 2012-12-22: qty 40

## 2012-12-22 MED ORDER — CEFTRIAXONE SODIUM 1 G IJ SOLR
1.0000 g | Freq: Once | INTRAMUSCULAR | Status: AC
  Administered 2012-12-22: 1 g via INTRAMUSCULAR
  Filled 2012-12-22: qty 10

## 2012-12-22 MED ORDER — IOHEXOL 300 MG/ML  SOLN: 50.0000 mL | INTRAMUSCULAR | Status: AC

## 2012-12-22 MED ORDER — LANSOPRAZOLE 30 MG PO CPDR: 30.0000 mg | DELAYED_RELEASE_CAPSULE | Freq: Every day | ORAL | Status: DC

## 2012-12-22 MED ORDER — HYDROMORPHONE HCL PF 1 MG/ML IJ SOLN
1.0000 mg | Freq: Once | INTRAMUSCULAR | Status: AC
  Administered 2012-12-22: 1 mg via INTRAVENOUS
  Filled 2012-12-22: qty 1

## 2012-12-22 MED ORDER — FENTANYL CITRATE 0.05 MG/ML IJ SOLN
100.0000 ug | Freq: Once | INTRAMUSCULAR | Status: AC
  Administered 2012-12-22: 100 ug via INTRAVENOUS
  Filled 2012-12-22: qty 2

## 2012-12-22 MED ORDER — ONDANSETRON 4 MG PO TBDP
4.0000 mg | ORAL_TABLET | Freq: Once | ORAL | Status: AC
  Administered 2012-12-22: 4 mg via ORAL
  Filled 2012-12-22: qty 1

## 2012-12-22 MED ORDER — SULFAMETHOXAZOLE-TRIMETHOPRIM 800-160 MG PO TABS: 1.0000 | ORAL_TABLET | Freq: Two times a day (BID) | ORAL | Status: DC

## 2012-12-22 MED ORDER — IOHEXOL 300 MG/ML  SOLN
100.0000 mL | Freq: Once | INTRAMUSCULAR | Status: AC | PRN
  Administered 2012-12-22: 100 mL via INTRAVENOUS

## 2012-12-22 MED ORDER — OXYCODONE-ACETAMINOPHEN 5-325 MG PO TABS: 1.0000 | ORAL_TABLET | ORAL | Status: DC | PRN

## 2012-12-22 NOTE — ED Provider Notes (Signed)
History    CSN: [id number] Arrival date & time 12/21/12  2206  First MD Initiated Contact with Patient 12/22/12 0057     Chief Complaint  Patient presents with  . Abdominal Pain   (Consider location/radiation/quality/duration/timing/severity/associated sxs/prior Treatment) HPI This is a 32 year old female with a history of Crohn's disease status post colectomy with ileostomy. She is having left upper quadrant pain that began 4 days ago. The pain is located up underneath her rib cage. She describes it as feeling like "a knot" and the pain is characterized as feeling like gas. It is moderate to severe at its worst. She is taken Gas-X and rhythm without relief. She has been nauseated and did vomit 4 days ago. Her ileostomy continues to have output. When she lies supine she feels the pain radiate to her left shoulder and back.  Past Medical History  Diagnosis Date  . Crohn's disease   . Psoriasis    Past Surgical History  Procedure Laterality Date  . Ileostomy     Family History  Problem Relation Age of Onset  . Hyperlipidemia Father   . Hypertension Father   . Diabetes Neg Hx   . Heart attack Neg Hx   . Sudden death Neg Hx    History  Substance Use Topics  . Smoking status: Current Every Day Smoker -- 0.50 packs/day    Types: Cigarettes  . Smokeless tobacco: Not on file  . Alcohol Use: No     Comment: occ   OB History   Grav Para Term Preterm Abortions TAB SAB Ect Mult Living                 Review of Systems  All other systems reviewed and are negative.    Allergies  Chocolate and Aspirin  Home Medications   Current Outpatient Rx  Name  Route  Sig  Dispense  Refill  . ibuprofen (ADVIL,MOTRIN) 800 MG tablet   Oral   Take 1 tablet (800 mg total) by mouth 3 (three) times daily.   21 tablet   0   . inFLIXimab (REMICADE) 100 MG injection   Intravenous   Inject 100 mg into the vein every 8 (eight) weeks.         . Multiple Vitamin (MULITIVITAMIN WITH  MINERALS) TABS   Oral   Take 1 tablet by mouth daily.         Marland Kitchen oxyCODONE-acetaminophen (PERCOCET/ROXICET) 5-325 MG per tablet   Oral   Take 2 tablets by mouth every 4 (four) hours as needed for pain.   15 tablet   0   . oxyCODONE-acetaminophen (PERCOCET/ROXICET) 5-325 MG per tablet   Oral   Take 1 tablet by mouth every 4 (four) hours as needed for pain.   15 tablet   0   . sulfamethoxazole-trimethoprim (SEPTRA DS) 800-160 MG per tablet   Oral   Take 1 tablet by mouth every 12 (twelve) hours.   20 tablet   0   . traMADol (ULTRAM) 50 MG tablet   Oral   Take 1 tablet (50 mg total) by mouth every 6 (six) hours as needed for pain.   15 tablet   0    BP 108/68  Pulse 63  Temp(Src) 98.3 F (36.8 C) (Oral)  Resp 18  Ht 5\' 7"  (1.702 m)  Wt 195 lb (88.451 kg)  BMI 30.53 kg/m2  SpO2 100%  LMP 12/14/2012  Physical Exam General: Well-developed, well-nourished female in no acute distress; appearance consistent  with age of record HENT: normocephalic, atraumatic Eyes: pupils equal round and reactive to light; extraocular muscles intact Neck: supple Heart: regular rate and rhythm Lungs: clear to auscultation bilaterally Abdomen: soft; nondistended; nontender; no masses or hepatosplenomegaly; bowel sounds hypoactive; ileostomy in right abdomen; multiple old well-healed surgical scars Extremities: No deformity; full range of motion; pulses normal; no edema Neurologic: Awake, alert and oriented; motor function intact in all extremities and symmetric; no facial droop Skin: Warm and dry Psychiatric: Tearful    ED Course  Procedures (including critical care time)   MDM   Nursing notes and vitals signs, including pulse oximetry, reviewed.  Summary of this visit's results, reviewed by myself:  Labs:  Results for orders placed during the hospital encounter of 12/21/12 (from the past 24 hour(s))  URINALYSIS, ROUTINE W REFLEX MICROSCOPIC     Status: Abnormal   Collection  Time    12/21/12 10:25 PM      Result Value Range   Color, Urine YELLOW  YELLOW   APPearance CLOUDY (*) CLEAR   Specific Gravity, Urine 1.030  1.005 - 1.030   pH 6.5  5.0 - 8.0   Glucose, UA NEGATIVE  NEGATIVE mg/dL   Hgb urine dipstick SMALL (*) NEGATIVE   Bilirubin Urine NEGATIVE  NEGATIVE   Ketones, ur 15 (*) NEGATIVE mg/dL   Protein, ur NEGATIVE  NEGATIVE mg/dL   Urobilinogen, UA 0.2  0.0 - 1.0 mg/dL   Nitrite POSITIVE (*) NEGATIVE   Leukocytes, UA SMALL (*) NEGATIVE  PREGNANCY, URINE     Status: None   Collection Time    12/21/12 10:25 PM      Result Value Range   Preg Test, Ur NEGATIVE  NEGATIVE  URINE MICROSCOPIC-ADD ON     Status: Abnormal   Collection Time    12/21/12 10:25 PM      Result Value Range   Squamous Epithelial / LPF MANY (*) RARE   WBC, UA 7-10  <3 WBC/hpf   RBC / HPF 3-6  <3 RBC/hpf   Bacteria, UA MANY (*) RARE  CBC WITH DIFFERENTIAL     Status: Abnormal   Collection Time    12/22/12 12:02 AM      Result Value Range   WBC 9.6  4.0 - 10.5 K/uL   RBC 3.80 (*) 3.87 - 5.11 MIL/uL   Hemoglobin 12.8  12.0 - 15.0 g/dL   HCT 16.1  09.6 - 04.5 %   MCV 98.2  78.0 - 100.0 fL   MCH 33.7  26.0 - 34.0 pg   MCHC 34.3  30.0 - 36.0 g/dL   RDW 40.9  81.1 - 91.4 %   Platelets 293  150 - 400 K/uL   Neutrophils Relative % 56  43 - 77 %   Neutro Abs 5.4  1.7 - 7.7 K/uL   Lymphocytes Relative 36  12 - 46 %   Lymphs Abs 3.5  0.7 - 4.0 K/uL   Monocytes Relative 6  3 - 12 %   Monocytes Absolute 0.6  0.1 - 1.0 K/uL   Eosinophils Relative 2  0 - 5 %   Eosinophils Absolute 0.2  0.0 - 0.7 K/uL   Basophils Relative 0  0 - 1 %   Basophils Absolute 0.0  0.0 - 0.1 K/uL  COMPREHENSIVE METABOLIC PANEL     Status: Abnormal   Collection Time    12/22/12 12:02 AM      Result Value Range   Sodium 136  135 -  145 mEq/L   Potassium 3.4 (*) 3.5 - 5.1 mEq/L   Chloride 95 (*) 96 - 112 mEq/L   CO2 28  19 - 32 mEq/L   Glucose, Bld 95  70 - 99 mg/dL   BUN 15  6 - 23 mg/dL    Creatinine, Ser 2.95  0.50 - 1.10 mg/dL   Calcium 62.1  8.4 - 30.8 mg/dL   Total Protein 7.8  6.0 - 8.3 g/dL   Albumin 3.8  3.5 - 5.2 g/dL   AST 21  0 - 37 U/L   ALT 19  0 - 35 U/L   Alkaline Phosphatase 77  39 - 117 U/L   Total Bilirubin 0.1 (*) 0.3 - 1.2 mg/dL   GFR calc non Af Amer >90  >90 mL/min   GFR calc Af Amer >90  >90 mL/min  LIPASE, BLOOD     Status: None   Collection Time    12/22/12 12:02 AM      Result Value Range   Lipase 32  11 - 59 U/L    Imaging Studies: Ct Abdomen Pelvis W Contrast  12/22/2012   *RADIOLOGY REPORT*  Clinical Data: Left upper quadrant pain for weeks.  History of Crohn's with colectomy and ileostomy.  CT ABDOMEN AND PELVIS WITH CONTRAST  Technique:  Multidetector CT imaging of the abdomen and pelvis was performed following the standard protocol during bolus administration of intravenous contrast.  Contrast: OMNIPAQUE IOHEXOL 300 MG/ML  SOLN  Comparison: Pain.  Findings:  Body while:  Nonspecific infiltration of the subcutaneous fat of the mons pubis.  LOWER CHEST:  Mediastinum: Unremarkable.  Lungs/pleura: Mild scarring in the medial segment right middle lobe.  ABDOMEN/PELVIS:  Liver: No focal abnormality.  Biliary: No evidence of biliary obstruction or stone.  Pancreas: Unremarkable.  Spleen: Unremarkable.  Adrenals: Unremarkable.  Kidneys and ureters: No hydronephrosis or stone. 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  Bowel: No bowel obstruction.  No active enteritis detected, although positive enteric contrast decreases sensitivity for detecting mucosal hyperenhancement.  Status post colectomy with right lower quadrant ileostomy.  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  15 mm single wall thickness of the gastric antrum, with appearance of mucosal edema.  Peritoneum: No free fluid or gas.  Reproductive: There are ovarian cysts, two of which measure 2.5 cm.  Vascular: No  acute abnormality.  OSSEOUS: No acute abnormalities. No suspicious lytic or blastic lesions.  IMPRESSION:  1. Findings of gastric antritis.  2. No evidence of active enteritis.  No bowel obstruction. 3.  Stomal hernia containing nonobstructed ileum. 4.  Ovarian cysts, measuring up to 2.5 cm.   Original Report Authenticated By: Tiburcio Pea    3:16 AM Patient advised of CT of lab findings. Her pain is likely due to her antral gastritis which has been aggravated by taking ibuprofen. We will start her on an acid blocker and advise that she stop taking NSAIDs.   Hanley Seamen, MD 12/22/12 [phone]

## 2012-12-26 LAB — URINE CULTURE: Colony Count: >=100000

## 2012-12-27 NOTE — ED Notes (Signed)
+   Urine Patient treated with Sulfa-Trim-sensitive to same-chart appended per protocol

## 2013-03-17 ENCOUNTER — Encounter (HOSPITAL_BASED_OUTPATIENT_CLINIC_OR_DEPARTMENT_OTHER): Payer: Self-pay | Admitting: Emergency Medicine

## 2013-03-17 ENCOUNTER — Emergency Department (HOSPITAL_BASED_OUTPATIENT_CLINIC_OR_DEPARTMENT_OTHER): Payer: PRIVATE HEALTH INSURANCE

## 2013-03-17 ENCOUNTER — Emergency Department (HOSPITAL_BASED_OUTPATIENT_CLINIC_OR_DEPARTMENT_OTHER)
Admission: EM | Admit: 2013-03-17 | Discharge: 2013-03-17 | Disposition: A | Discharge status: Home / Self Care | Payer: PRIVATE HEALTH INSURANCE | Attending: Emergency Medicine | Admitting: Emergency Medicine

## 2013-03-17 DIAGNOSIS — Z87828 Personal history of other (healed) physical injury and trauma: Secondary | ICD-10-CM | POA: Insufficient documentation

## 2013-03-17 DIAGNOSIS — IMO0001 Reserved for inherently not codable concepts without codable children: Secondary | ICD-10-CM | POA: Insufficient documentation

## 2013-03-17 DIAGNOSIS — Z79899 Other long term (current) drug therapy: Secondary | ICD-10-CM | POA: Insufficient documentation

## 2013-03-17 DIAGNOSIS — M25519 Pain in unspecified shoulder: Secondary | ICD-10-CM | POA: Insufficient documentation

## 2013-03-17 DIAGNOSIS — F17200 Nicotine dependence, unspecified, uncomplicated: Secondary | ICD-10-CM | POA: Insufficient documentation

## 2013-03-17 DIAGNOSIS — Z872 Personal history of diseases of the skin and subcutaneous tissue: Secondary | ICD-10-CM | POA: Insufficient documentation

## 2013-03-17 DIAGNOSIS — G8911 Acute pain due to trauma: Secondary | ICD-10-CM | POA: Insufficient documentation

## 2013-03-17 DIAGNOSIS — M25512 Pain in left shoulder: Secondary | ICD-10-CM

## 2013-03-17 DIAGNOSIS — Z8719 Personal history of other diseases of the digestive system: Secondary | ICD-10-CM | POA: Insufficient documentation

## 2013-03-17 MED ORDER — PREDNISONE 10 MG PO TABS: 20.0000 mg | ORAL_TABLET | Freq: Two times a day (BID) | ORAL | Status: DC

## 2013-03-17 MED ORDER — HYDROCODONE-ACETAMINOPHEN 5-325 MG PO TABS: 2.0000 | ORAL_TABLET | ORAL | Status: DC | PRN

## 2013-03-17 MED ORDER — HYDROCODONE-ACETAMINOPHEN 5-325 MG PO TABS
2.0000 | ORAL_TABLET | Freq: Once | ORAL | Status: AC
  Administered 2013-03-17: 2 via ORAL
  Filled 2013-03-17: qty 2

## 2013-03-17 NOTE — ED Notes (Signed)
Ice pack offered

## 2013-03-17 NOTE — ED Notes (Signed)
Reports pain in left arm since yesterday, worse this morning- states she has been moving and lifting boxes- denies specific injury
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
## 2013-03-17 NOTE — ED Notes (Signed)
MD at bedside. 

## 2013-03-21 ENCOUNTER — Emergency Department (HOSPITAL_BASED_OUTPATIENT_CLINIC_OR_DEPARTMENT_OTHER)
Admission: EM | Admit: 2013-03-21 | Discharge: 2013-03-21 | Disposition: A | Discharge status: Home / Self Care | Payer: PRIVATE HEALTH INSURANCE | Attending: Emergency Medicine | Admitting: Emergency Medicine

## 2013-03-21 ENCOUNTER — Encounter (HOSPITAL_BASED_OUTPATIENT_CLINIC_OR_DEPARTMENT_OTHER): Payer: Self-pay | Admitting: Emergency Medicine

## 2013-03-21 DIAGNOSIS — M5412 Radiculopathy, cervical region: Secondary | ICD-10-CM | POA: Insufficient documentation

## 2013-03-21 DIAGNOSIS — K5090 Crohn's disease, unspecified, without complications: Secondary | ICD-10-CM | POA: Insufficient documentation

## 2013-03-21 DIAGNOSIS — F17200 Nicotine dependence, unspecified, uncomplicated: Secondary | ICD-10-CM | POA: Insufficient documentation

## 2013-03-21 DIAGNOSIS — Z79899 Other long term (current) drug therapy: Secondary | ICD-10-CM | POA: Insufficient documentation

## 2013-03-21 DIAGNOSIS — L408 Other psoriasis: Secondary | ICD-10-CM | POA: Insufficient documentation

## 2013-03-21 MED ORDER — CYCLOBENZAPRINE HCL 10 MG PO TABS: 10.0000 mg | ORAL_TABLET | Freq: Three times a day (TID) | ORAL | Status: DC | PRN
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
## 2013-03-21 NOTE — ED Notes (Signed)
C/o left arm pain and aching since last night, no relief from OTC meds. Pt denies injury.

## 2013-05-08 ENCOUNTER — Emergency Department (HOSPITAL_BASED_OUTPATIENT_CLINIC_OR_DEPARTMENT_OTHER)
Admission: EM | Admit: 2013-05-08 | Discharge: 2013-05-08 | Disposition: A | Discharge status: Home / Self Care | Payer: PRIVATE HEALTH INSURANCE | Attending: Emergency Medicine | Admitting: Emergency Medicine

## 2013-05-08 ENCOUNTER — Encounter (HOSPITAL_BASED_OUTPATIENT_CLINIC_OR_DEPARTMENT_OTHER): Payer: Self-pay | Admitting: Emergency Medicine

## 2013-05-08 DIAGNOSIS — Z79899 Other long term (current) drug therapy: Secondary | ICD-10-CM | POA: Insufficient documentation

## 2013-05-08 DIAGNOSIS — F17200 Nicotine dependence, unspecified, uncomplicated: Secondary | ICD-10-CM | POA: Diagnosis not present

## 2013-05-08 DIAGNOSIS — IMO0002 Reserved for concepts with insufficient information to code with codable children: Secondary | ICD-10-CM | POA: Diagnosis present

## 2013-05-08 DIAGNOSIS — Z8719 Personal history of other diseases of the digestive system: Secondary | ICD-10-CM | POA: Insufficient documentation

## 2013-05-08 DIAGNOSIS — L02411 Cutaneous abscess of right axilla: Secondary | ICD-10-CM

## 2013-05-08 MED ORDER — HYDROCODONE-ACETAMINOPHEN 5-325 MG PO TABS: 2.0000 | ORAL_TABLET | ORAL | Status: DC | PRN

## 2013-05-08 MED ORDER — SULFAMETHOXAZOLE-TRIMETHOPRIM 800-160 MG PO TABS: 1.0000 | ORAL_TABLET | Freq: Two times a day (BID) | ORAL | Status: AC

## 2013-05-08 NOTE — ED Provider Notes (Signed)
CSN: [id number]     Arrival date & time 05/08/13  1630 History   First MD Initiated Contact with Patient 05/08/13 1653     Chief Complaint  Patient presents with  . Abscess   (Consider location/radiation/quality/duration/timing/severity/associated sxs/prior Treatment) Patient is a 32 y.o. female presenting with abscess. The history is provided by the patient.  Abscess Location:  Shoulder/arm Shoulder/arm abscess location:  R axilla Size:  2 Abscess quality: painful and redness   Red streaking: no   Progression:  Worsening Pain details:    Quality:  Aching   Severity:  Moderate Relieved by:  Nothing Worsened by:  Nothing tried   Past Medical History  Diagnosis Date  . Crohn's disease   . Psoriasis    Past Surgical History  Procedure Laterality Date  . Ileostomy     Family History  Problem Relation Age of Onset  . Hyperlipidemia Father   . Hypertension Father   . Diabetes Neg Hx   . Heart attack Neg Hx   . Sudden death Neg Hx    History  Substance Use Topics  . Smoking status: Current Every Day Smoker -- 0.50 packs/day    Types: Cigarettes  . Smokeless tobacco: Never Used  . Alcohol Use: No     Comment: occ   OB History   Grav Para Term Preterm Abortions TAB SAB Ect Mult Living                 Review of Systems  Skin: Positive for wound.  All other systems reviewed and are negative.    Allergies  Chocolate; Aspirin; and Ibuprofen  Home Medications   Current Outpatient Rx  Name  Route  Sig  Dispense  Refill  . cyclobenzaprine (FLEXERIL) 10 MG tablet   Oral   Take 1 tablet (10 mg total) by mouth 3 (three) times daily as needed for muscle spasms.   30 tablet   0   . HYDROcodone-acetaminophen (NORCO/VICODIN) 5-325 MG per tablet   Oral   Take 2 tablets by mouth every 4 (four) hours as needed.   10 tablet   0   . inFLIXimab (REMICADE) 100 MG injection   Intravenous   Inject 100 mg into the vein every 8 (eight) weeks.         . Multiple  Vitamin (MULITIVITAMIN WITH MINERALS) TABS   Oral   Take 1 tablet by mouth daily.         Marland Kitchen sulfamethoxazole-trimethoprim (BACTRIM DS,SEPTRA DS) 800-160 MG per tablet   Oral   Take 1 tablet by mouth 2 (two) times daily.   14 tablet   0    BP 101/83  Pulse 98  Temp(Src) 98.6 F (37 C) (Oral)  Resp 16  Ht 5\' 7"  (1.702 m)  Wt 184 lb (83.462 kg)  BMI 28.81 kg/m2  SpO2 100%  LMP 05/05/2013 Physical Exam  Nursing note and vitals reviewed. Constitutional: She appears well-developed and well-nourished.  Musculoskeletal: She exhibits tenderness.  Swollen area right axilla 2 cm   Neurological: She is alert.  Skin: Skin is warm.  Psychiatric: She has a normal mood and affect.    ED Course  INCISION AND DRAINAGE Date/Time: 05/08/2013 5:44 PM Performed by: Elson Areas Authorized by: Elson Areas Consent: written consent obtained. Risks and benefits: risks, benefits and alternatives were discussed Consent given by: patient Required items: required blood products, implants, devices, and special equipment available Patient identity confirmed: verbally with patient Type: abscess Body area: upper  extremity Anesthesia: local infiltration Local anesthetic: lidocaine 2% without epinephrine Scalpel size: 11 Incision type: single straight Complexity: simple Drainage: purulent Drainage amount: moderate Packing material: 1/2 in gauze Patient tolerance: Patient tolerated the procedure well with no immediate complications.   (including critical care time) Labs Review Labs Reviewed - No data to display Imaging Review No results found.  EKG Interpretation   None       MDM   1. Abscess of axilla, right    Bactrim and hydrocodone    Elson Areas, PA-C 05/08/13 1745

## 2013-05-08 NOTE — ED Notes (Signed)
Abscess in right axilla x4 days.

## 2013-05-08 NOTE — ED Notes (Signed)
PA Sofia at bedside for I&D

## 2013-05-09 NOTE — ED Provider Notes (Signed)
Medical screening examination/treatment/procedure(s) were performed by non-physician practitioner and as supervising physician I was immediately available for consultation/collaboration.  EKG Interpretation   None        Shon Baton, MD 05/09/13 [phone]

## 2013-07-23 ENCOUNTER — Ambulatory Visit (INDEPENDENT_AMBULATORY_CARE_PROVIDER_SITE_OTHER): Payer: PRIVATE HEALTH INSURANCE | Admitting: Family Medicine

## 2013-07-23 ENCOUNTER — Encounter: Payer: Self-pay | Admitting: Family Medicine

## 2013-07-23 VITALS — BP 117/80 | HR 94 | Ht 67.0 in | Wt 182.0 lb

## 2013-07-23 DIAGNOSIS — M542 Cervicalgia: Secondary | ICD-10-CM

## 2013-07-23 MED ORDER — TRAMADOL HCL 50 MG PO TABS: 50.0000 mg | ORAL_TABLET | Freq: Three times a day (TID) | ORAL | Status: DC | PRN

## 2013-07-23 MED ORDER — DIAZEPAM 5 MG PO TABS: ORAL_TABLET | ORAL | Status: DC

## 2013-07-23 MED ORDER — TIZANIDINE HCL 4 MG PO TABS: 4.0000 mg | ORAL_TABLET | Freq: Three times a day (TID) | ORAL | Status: DC | PRN

## 2013-07-23 MED ORDER — CYCLOBENZAPRINE HCL 10 MG PO TABS: 10.0000 mg | ORAL_TABLET | Freq: Three times a day (TID) | ORAL | Status: DC | PRN

## 2013-07-23 NOTE — Patient Instructions (Signed)
Your symptoms are consistent with cervical radiculopathy (a pinched nerve in the neck). Continue your prednisone. Flexeril three times a day as needed for muscle spasms (can make you sleepy - if so do not drive while taking this). Tramadol for severe pain (no driving on this medicine) - take at same time as tylenol three times a day. Consider cervical collar if severely painful. Simple range of motion exercises within limits of pain to prevent further stiffness. Consider physical therapy for stretching, exercises, traction, and modalities. Heat 15 minutes at a time 3-4 times a day to help with spasms. Watch head position when on computers, texting, when sleeping in bed - should in line with back to prevent further nerve traction and irritation. We will go ahead with an MRI of your cervical spine.

## 2013-07-25 ENCOUNTER — Encounter: Payer: Self-pay | Admitting: Family Medicine

## 2013-07-25 DIAGNOSIS — M542 Cervicalgia: Secondary | ICD-10-CM | POA: Insufficient documentation

## 2013-07-25 NOTE — Assessment & Plan Note (Signed)
consistent with cervical radiculopathy.  She is currently on prednisone for crohns disease - 40mg  now - and not much benefit.  She did prior PT, nitro patches, cortisone injections for shoulder but current symptoms are related to cervical spine.  Will go ahead with MRI to further assess for disc herniation, nerve root irritation.  Flexeril, tramadol as needed.  Consider PT, ESIs, neurosurgery referral depending on results.
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
## 2013-08-06 DIAGNOSIS — K5010 Crohn's disease of large intestine without complications: Secondary | ICD-10-CM | POA: Insufficient documentation

## 2013-08-06 HISTORY — PX: TOTAL PROCTECTOMY: SHX2544

## 2013-08-07 HISTORY — PX: PERCUTANEOUS NEPHROSTOMY: SHX2208

## 2013-08-08 DIAGNOSIS — E663 Overweight: Secondary | ICD-10-CM | POA: Insufficient documentation

## 2013-08-08 DIAGNOSIS — C439 Malignant melanoma of skin, unspecified: Secondary | ICD-10-CM | POA: Insufficient documentation

## 2013-09-03 HISTORY — PX: INCISE AND DRAIN ABCESS: PRO64

## 2013-10-14 ENCOUNTER — Encounter (HOSPITAL_BASED_OUTPATIENT_CLINIC_OR_DEPARTMENT_OTHER): Payer: Self-pay | Admitting: Emergency Medicine

## 2013-10-14 ENCOUNTER — Emergency Department (HOSPITAL_BASED_OUTPATIENT_CLINIC_OR_DEPARTMENT_OTHER)
Admission: EM | Admit: 2013-10-14 | Discharge: 2013-10-14 | Disposition: A | Discharge status: Home / Self Care | Payer: PRIVATE HEALTH INSURANCE | Attending: Emergency Medicine | Admitting: Emergency Medicine

## 2013-10-14 DIAGNOSIS — N390 Urinary tract infection, site not specified: Secondary | ICD-10-CM

## 2013-10-14 DIAGNOSIS — Z79899 Other long term (current) drug therapy: Secondary | ICD-10-CM | POA: Insufficient documentation

## 2013-10-14 DIAGNOSIS — Z8719 Personal history of other diseases of the digestive system: Secondary | ICD-10-CM | POA: Insufficient documentation

## 2013-10-14 DIAGNOSIS — Z792 Long term (current) use of antibiotics: Secondary | ICD-10-CM | POA: Insufficient documentation

## 2013-10-14 DIAGNOSIS — F17200 Nicotine dependence, unspecified, uncomplicated: Secondary | ICD-10-CM | POA: Insufficient documentation

## 2013-10-14 DIAGNOSIS — Z872 Personal history of diseases of the skin and subcutaneous tissue: Secondary | ICD-10-CM | POA: Insufficient documentation

## 2013-10-14 LAB — BASIC METABOLIC PANEL
BUN: 8 mg/dL (ref 6–23)
CALCIUM: 10 mg/dL (ref 8.4–10.5)
CO2: 19 meq/L (ref 19–32)
Chloride: 105 mEq/L (ref 96–112)
Creatinine, Ser: 0.5 mg/dL (ref 0.50–1.10)
GFR calc Af Amer: >90 mL/min (ref >90)
Glucose, Bld: 97 mg/dL (ref 70–99)
Potassium: 4 mEq/L (ref 3.7–5.3)
SODIUM: 141 meq/L (ref 137–147)

## 2013-10-14 LAB — CBC WITH DIFFERENTIAL/PLATELET
BASOS ABS: 0 10*3/uL (ref 0.0–0.1)
Basophils Relative: 0 % (ref 0–1)
EOS ABS: 0.1 10*3/uL (ref 0.0–0.7)
Eosinophils Relative: 2 % (ref 0–5)
HCT: 39.3 % (ref 36.0–46.0)
Hemoglobin: 13.7 g/dL (ref 12.0–15.0)
LYMPHS PCT: 35 % (ref 12–46)
Lymphs Abs: 3 10*3/uL (ref 0.7–4.0)
MCH: 31.4 pg (ref 26.0–34.0)
MCHC: 34.9 g/dL (ref 30.0–36.0)
MCV: 89.9 fL (ref 78.0–100.0)
Monocytes Absolute: 0.7 10*3/uL (ref 0.1–1.0)
Monocytes Relative: 8 % (ref 3–12)
NEUTROS PCT: 55 % (ref 43–77)
Neutro Abs: 4.6 10*3/uL (ref 1.7–7.7)
PLATELETS: 401 10*3/uL — AB (ref 150–400)
RBC: 4.37 MIL/uL (ref 3.87–5.11)
RDW: 15.3 % (ref 11.5–15.5)
WBC: 8.3 10*3/uL (ref 4.0–10.5)

## 2013-10-14 LAB — URINALYSIS, ROUTINE W REFLEX MICROSCOPIC
Glucose, UA: NEGATIVE mg/dL
Ketones, ur: 15 mg/dL — AB
Nitrite: POSITIVE — AB
Protein, ur: 100 mg/dL — AB
Specific Gravity, Urine: 1.018 (ref 1.005–1.030)
Urobilinogen, UA: 0.2 mg/dL (ref 0.0–1.0)
pH: 6.5 (ref 5.0–8.0)

## 2013-10-14 LAB — URINE MICROSCOPIC-ADD ON

## 2013-10-14 MED ORDER — SODIUM CHLORIDE 0.9 % IV SOLN
1000.0000 mL | INTRAVENOUS | Status: DC
  Administered 2013-10-14: 1000 mL via INTRAVENOUS

## 2013-10-14 MED ORDER — MORPHINE SULFATE 4 MG/ML IJ SOLN
4.0000 mg | Freq: Once | INTRAMUSCULAR | Status: AC
  Administered 2013-10-14: 4 mg via INTRAVENOUS
  Filled 2013-10-14: qty 1

## 2013-10-14 MED ORDER — OXYCODONE-ACETAMINOPHEN 5-325 MG PO TABS: 1.0000 | ORAL_TABLET | ORAL | Status: DC | PRN

## 2013-10-14 MED ORDER — CEPHALEXIN 500 MG PO CAPS: 500.0000 mg | ORAL_CAPSULE | Freq: Four times a day (QID) | ORAL | Status: DC

## 2013-10-14 MED ORDER — SODIUM CHLORIDE 0.9 % IV SOLN
1000.0000 mL | Freq: Once | INTRAVENOUS | Status: AC
  Administered 2013-10-14: 1000 mL via INTRAVENOUS

## 2013-10-14 NOTE — ED Notes (Signed)
Pt having left side back pain for several days.  Pt states she feels like it is getting worse.  No known fever but some chills.

## 2013-10-14 NOTE — Discharge Instructions (Signed)
Urinary Tract Infection  Urinary tract infections (UTIs) can develop anywhere along your urinary tract. Your urinary tract is your body's drainage system for removing wastes and extra water. Your urinary tract includes two kidneys, two ureters, a bladder, and a urethra. Your kidneys are a pair of bean-shaped organs. Each kidney is about the size of your fist. They are located below your ribs, one on each side of your spine.  CAUSES  Infections are caused by microbes, which are microscopic organisms, including fungi, viruses, and bacteria. These organisms are so small that they can only be seen through a microscope. Bacteria are the microbes that most commonly cause UTIs.  SYMPTOMS   Symptoms of UTIs may vary by age and gender of the patient and by the location of the infection. Symptoms in young women typically include a frequent and intense urge to urinate and a painful, burning feeling in the bladder or urethra during urination. Older women and men are more likely to be tired, shaky, and weak and have muscle aches and abdominal pain. A fever may mean the infection is in your kidneys. Other symptoms of a kidney infection include pain in your back or sides below the ribs, nausea, and vomiting.  DIAGNOSIS  To diagnose a UTI, your caregiver will ask you about your symptoms. Your caregiver also will ask to provide a urine sample. The urine sample will be tested for bacteria and white blood cells. White blood cells are made by your body to help fight infection.  TREATMENT   Typically, UTIs can be treated with medication. Because most UTIs are caused by a bacterial infection, they usually can be treated with the use of antibiotics. The choice of antibiotic and length of treatment depend on your symptoms and the type of bacteria causing your infection.  HOME CARE INSTRUCTIONS   If you were prescribed antibiotics, take them exactly as your caregiver instructs you. Finish the medication even if you feel better after you  have only taken some of the medication.   Drink enough water and fluids to keep your urine clear or pale yellow.   Avoid caffeine, tea, and carbonated beverages. They tend to irritate your bladder.   Empty your bladder often. Avoid holding urine for long periods of time.   Empty your bladder before and after sexual intercourse.   After a bowel movement, women should cleanse from front to back. Use each tissue only once.  SEEK MEDICAL CARE IF:    You have back pain.   You develop a fever.   Your symptoms do not begin to resolve within 3 days.  SEEK IMMEDIATE MEDICAL CARE IF:    You have severe back pain or lower abdominal pain.   You develop chills.   You have nausea or vomiting.   You have continued burning or discomfort with urination.  MAKE SURE YOU:    Understand these instructions.   Will watch your condition.   Will get help right away if you are not doing well or get worse.  Document Released: 03/03/2005 Document Revised: 11/23/2011 Document Reviewed: 07/02/2011  [employer] Patient Information 2014 [employer]

## 2013-10-15 LAB — URINE CULTURE: Colony Count: >=100000

## 2013-10-21 NOTE — ED Provider Notes (Signed)
CSN: [id number]     Arrival date & time 10/14/13  1823 History   First MD Initiated Contact with Patient 10/14/13 1906     Chief Complaint  Patient presents with  . Back Pain     (Consider location/radiation/quality/duration/timing/severity/associated sxs/prior Treatment) Patient is a 33 y.o. female presenting with back pain. The history is provided by the patient. No language interpreter was used.  Back Pain Quality:  Aching Associated symptoms: no abdominal pain, no chest pain, no dysuria, no fever, no headaches and no numbness   Associated symptoms comment:  Pain to the left side of her back for several days without known injury. NO fever, but she reports chills. She has not been vomiting or had any diarrhea. No vaginal discharge, abnormal bleeding. She denies dysuria or urinary frequency.    Past Medical History  Diagnosis Date  . Crohn's disease   . Psoriasis    Past Surgical History  Procedure Laterality Date  . Ileostomy    . Nephrostomy     Family History  Problem Relation Age of Onset  . Hyperlipidemia Father   . Hypertension Father   . Diabetes Neg Hx   . Heart attack Neg Hx   . Sudden death Neg Hx    History  Substance Use Topics  . Smoking status: Current Every Day Smoker -- 0.50 packs/day    Types: Cigarettes  . Smokeless tobacco: Never Used  . Alcohol Use: No     Comment: occ   OB History   Grav Para Term Preterm Abortions TAB SAB Ect Mult Living                 Review of Systems  Constitutional: Positive for chills. Negative for fever.  Respiratory: Negative.  Negative for cough and shortness of breath.   Cardiovascular: Negative.  Negative for chest pain.  Gastrointestinal: Negative.  Negative for nausea, vomiting, abdominal pain and diarrhea.  Genitourinary: Negative for dysuria and frequency.  Musculoskeletal: Positive for back pain. Negative for myalgias.  Skin: Negative.   Neurological: Negative.  Negative for numbness and headaches.       Allergies  Chocolate; Aspirin; and Ibuprofen  Home Medications   Prior to Admission medications   Medication Sig Start Date End Date Taking? Authorizing Provider  acetaminophen (TYLENOL) 325 MG tablet Take 650 mg by mouth every 6 (six) hours as needed.   Yes Historical Provider, MD  ALPRAZolam Duanne Moron) 0.5 MG tablet Take 0.5 mg by mouth 2 (two) times daily as needed for anxiety.   Yes Historical Provider, MD  levofloxacin (LEVAQUIN) 750 MG tablet Take 750 mg by mouth daily.   Yes Historical Provider, MD  metroNIDAZOLE (FLAGYL) 500 MG tablet Take 500 mg by mouth 3 (three) times daily.   Yes Historical Provider, MD  ondansetron (ZOFRAN-ODT) 4 MG disintegrating tablet Take 4 mg by mouth every 8 (eight) hours as needed for nausea or vomiting.   Yes Historical Provider, MD  oxyCODONE-acetaminophen (PERCOCET/ROXICET) 5-325 MG per tablet Take by mouth every 4 (four) hours as needed for severe pain.   Yes Historical Provider, MD  cephALEXin (KEFLEX) 500 MG capsule Take 1 capsule (500 mg total) by mouth 4 (four) times daily. 10/14/13   Kristyl Athens A Gearl Baratta, PA-C  oxyCODONE-acetaminophen (PERCOCET/ROXICET) 5-325 MG per tablet Take 1-2 tablets by mouth every 4 (four) hours as needed for severe pain. 10/14/13   Alika Saladin A Aneshia Jacquet, PA-C   BP 90/65  Pulse 80  Temp(Src) 98.6 F (37 C) (Oral)  Resp 18  Ht 5\' 7"  (1.702 m)  Wt 159 lb (72.122 kg)  BMI 24.90 kg/m2  SpO2 100%  LMP 09/24/2013 Physical Exam  Constitutional: She is oriented to person, place, and time. She appears well-developed and well-nourished.  HENT:  Head: Normocephalic.  Neck: Normal range of motion. Neck supple.  Cardiovascular: Normal rate and regular rhythm.   Pulmonary/Chest: Effort normal and breath sounds normal.  Abdominal: Soft. Bowel sounds are normal. There is no tenderness. There is no rebound and no guarding.  Genitourinary:  No CVA tenderness.   Musculoskeletal: Normal range of motion. She exhibits no edema.  Mild left  paralumbar tenderness without swelling or discoloration.  Neurological: She is alert and oriented to person, place, and time. She has normal reflexes. Coordination normal.  Skin: Skin is warm and dry. No rash noted.  Psychiatric: She has a normal mood and affect.    ED Course  Procedures (including critical care time) Labs Review Labs Reviewed  CBC WITH DIFFERENTIAL - Abnormal; Notable for the following:    Platelets 401 (*)    All other components within normal limits  URINALYSIS, ROUTINE W REFLEX MICROSCOPIC - Abnormal; Notable for the following:    Color, Urine ORANGE (*)    APPearance CLOUDY (*)    Hgb urine dipstick LARGE (*)    Bilirubin Urine SMALL (*)    Ketones, ur 15 (*)    Protein, ur 100 (*)    Nitrite POSITIVE (*)    Leukocytes, UA LARGE (*)    All other components within normal limits  URINE MICROSCOPIC-ADD ON - Abnormal; Notable for the following:    Bacteria, UA MANY (*)    All other components within normal limits  URINE CULTURE  BASIC METABOLIC PANEL   Results for orders placed during the hospital encounter of 10/14/13  URINE CULTURE      Result Value Ref Range   Specimen Description URINE, CLEAN CATCH     Special Requests NONE     Culture  Setup Time       Value: 10/15/2013 03:48     Performed at [place]       Value: >=100,000 COLONIES/ML     Performed at [facility]   Culture       Value: DIPHTHEROIDS(CORYNEBACTERIUM SPECIES)     Note: Standardized susceptibility testing for this organism is not available.     Performed at [facility]   Report Status 10/15/2013 FINAL    CBC WITH DIFFERENTIAL      Result Value Ref Range   WBC 8.3  4.0 - 10.5 K/uL   RBC 4.37  3.87 - 5.11 MIL/uL   Hemoglobin 13.7  12.0 - 15.0 g/dL   HCT 39.3  36.0 - 46.0 %   MCV 89.9  78.0 - 100.0 fL   MCH 31.4  26.0 - 34.0 pg   MCHC 34.9  30.0 - 36.0 g/dL   RDW 15.3  11.5 - 15.5 %   Platelets 401 (*) 150 - 400 K/uL   Neutrophils  Relative % 55  43 - 77 %   Neutro Abs 4.6  1.7 - 7.7 K/uL   Lymphocytes Relative 35  12 - 46 %   Lymphs Abs 3.0  0.7 - 4.0 K/uL   Monocytes Relative 8  3 - 12 %   Monocytes Absolute 0.7  0.1 - 1.0 K/uL   Eosinophils Relative 2  0 - 5 %   Eosinophils Absolute 0.1  0.0 -  0.7 K/uL   Basophils Relative 0  0 - 1 %   Basophils Absolute 0.0  0.0 - 0.1 K/uL  BASIC METABOLIC PANEL      Result Value Ref Range   Sodium 141  137 - 147 mEq/L   Potassium 4.0  3.7 - 5.3 mEq/L   Chloride 105  96 - 112 mEq/L   CO2 19  19 - 32 mEq/L   Glucose, Bld 97  70 - 99 mg/dL   BUN 8  6 - 23 mg/dL   Creatinine, Ser 0.50  0.50 - 1.10 mg/dL   Calcium 10.0  8.4 - 10.5 mg/dL   GFR calc non Af Amer >90  >90 mL/min   GFR calc Af Amer >90  >90 mL/min  URINALYSIS, ROUTINE W REFLEX MICROSCOPIC      Result Value Ref Range   Color, Urine ORANGE (*) YELLOW   APPearance CLOUDY (*) CLEAR   Specific Gravity, Urine 1.018  1.005 - 1.030   pH 6.5  5.0 - 8.0   Glucose, UA NEGATIVE  NEGATIVE mg/dL   Hgb urine dipstick LARGE (*) NEGATIVE   Bilirubin Urine SMALL (*) NEGATIVE   Ketones, ur 15 (*) NEGATIVE mg/dL   Protein, ur 100 (*) NEGATIVE mg/dL   Urobilinogen, UA 0.2  0.0 - 1.0 mg/dL   Nitrite POSITIVE (*) NEGATIVE   Leukocytes, UA LARGE (*) NEGATIVE  URINE MICROSCOPIC-ADD ON      Result Value Ref Range   Squamous Epithelial / LPF RARE  RARE   WBC, UA 11-20  <3 WBC/hpf   RBC / HPF 21-50  <3 RBC/hpf   Bacteria, UA MANY (*) RARE   Urine-Other MUCOUS PRESENT      Imaging Review No results found.   EKG Interpretation None      MDM   Final diagnoses:  UTI (lower urinary tract infection)    Uncomplicated UTI, treat with Keflex - culture pending. Vital signs improved. No nausea/vomiting, drinking fluids, NAD.    Dewaine Oats, PA-C 10/21/13 1333

## 2013-10-22 NOTE — ED Provider Notes (Signed)
Medical screening examination/treatment/procedure(s) were performed by non-physician practitioner and as supervising physician I was immediately available for consultation/collaboration.   EKG Interpretation None        Charles B. Karle Starch, MD 10/22/13 1248

## 2014-01-02 DIAGNOSIS — S3710XA Unspecified injury of ureter, initial encounter: Secondary | ICD-10-CM | POA: Insufficient documentation

## 2014-03-17 ENCOUNTER — Emergency Department (HOSPITAL_BASED_OUTPATIENT_CLINIC_OR_DEPARTMENT_OTHER)
Admission: EM | Admit: 2014-03-17 | Discharge: 2014-03-17 | Disposition: A | Discharge status: Home / Self Care | Payer: PRIVATE HEALTH INSURANCE | Attending: Emergency Medicine | Admitting: Emergency Medicine

## 2014-03-17 ENCOUNTER — Emergency Department (HOSPITAL_BASED_OUTPATIENT_CLINIC_OR_DEPARTMENT_OTHER): Payer: PRIVATE HEALTH INSURANCE

## 2014-03-17 ENCOUNTER — Encounter (HOSPITAL_BASED_OUTPATIENT_CLINIC_OR_DEPARTMENT_OTHER): Payer: Self-pay | Admitting: Emergency Medicine

## 2014-03-17 DIAGNOSIS — K529 Noninfective gastroenteritis and colitis, unspecified: Secondary | ICD-10-CM | POA: Insufficient documentation

## 2014-03-17 DIAGNOSIS — Z792 Long term (current) use of antibiotics: Secondary | ICD-10-CM | POA: Insufficient documentation

## 2014-03-17 DIAGNOSIS — Z872 Personal history of diseases of the skin and subcutaneous tissue: Secondary | ICD-10-CM | POA: Insufficient documentation

## 2014-03-17 DIAGNOSIS — R05 Cough: Secondary | ICD-10-CM | POA: Diagnosis present

## 2014-03-17 DIAGNOSIS — R6889 Other general symptoms and signs: Secondary | ICD-10-CM

## 2014-03-17 DIAGNOSIS — Z720 Tobacco use: Secondary | ICD-10-CM | POA: Diagnosis not present

## 2014-03-17 DIAGNOSIS — Z79899 Other long term (current) drug therapy: Secondary | ICD-10-CM | POA: Diagnosis not present

## 2014-03-17 LAB — CBC WITH DIFFERENTIAL/PLATELET
BASOS ABS: 0 10*3/uL (ref 0.0–0.1)
BASOS PCT: 0 % (ref 0–1)
EOS PCT: 2 % (ref 0–5)
Eosinophils Absolute: 0.2 10*3/uL (ref 0.0–0.7)
HCT: 38.5 % (ref 36.0–46.0)
Hemoglobin: 13.1 g/dL (ref 12.0–15.0)
Lymphocytes Relative: 27 % (ref 12–46)
Lymphs Abs: 2.7 10*3/uL (ref 0.7–4.0)
MCH: 31.4 pg (ref 26.0–34.0)
MCHC: 34 g/dL (ref 30.0–36.0)
MCV: 92.3 fL (ref 78.0–100.0)
Monocytes Absolute: 0.7 10*3/uL (ref 0.1–1.0)
Monocytes Relative: 7 % (ref 3–12)
Neutro Abs: 6.5 10*3/uL (ref 1.7–7.7)
Neutrophils Relative %: 64 % (ref 43–77)
PLATELETS: 457 10*3/uL — AB (ref 150–400)
RBC: 4.17 MIL/uL (ref 3.87–5.11)
RDW: 13.9 % (ref 11.5–15.5)
WBC: 10.1 10*3/uL (ref 4.0–10.5)

## 2014-03-17 LAB — BASIC METABOLIC PANEL
ANION GAP: 17 — AB (ref 5–15)
BUN: 8 mg/dL (ref 6–23)
CALCIUM: 9.8 mg/dL (ref 8.4–10.5)
CO2: 20 mEq/L (ref 19–32)
Chloride: 101 mEq/L (ref 96–112)
Creatinine, Ser: 0.5 mg/dL (ref 0.50–1.10)
GFR calc Af Amer: >90 mL/min (ref >90)
GLUCOSE: 100 mg/dL — AB (ref 70–99)
Potassium: 3.9 mEq/L (ref 3.7–5.3)
Sodium: 138 mEq/L (ref 137–147)

## 2014-03-17 MED ORDER — ONDANSETRON 4 MG PO TBDP: 4.0000 mg | ORAL_TABLET | Freq: Three times a day (TID) | ORAL | Status: DC | PRN

## 2014-03-17 MED ORDER — TRAMADOL HCL 50 MG PO TABS: 50.0000 mg | ORAL_TABLET | Freq: Four times a day (QID) | ORAL | Status: DC | PRN

## 2014-03-17 MED ORDER — SODIUM CHLORIDE 0.9 % IV BOLUS (SEPSIS)
1000.0000 mL | Freq: Once | INTRAVENOUS | Status: AC
  Administered 2014-03-17: 1000 mL via INTRAVENOUS

## 2014-03-17 MED ORDER — DM-GUAIFENESIN ER 30-600 MG PO TB12: 1.0000 | ORAL_TABLET | Freq: Two times a day (BID) | ORAL | Status: DC

## 2014-03-17 MED ORDER — KETOROLAC TROMETHAMINE 30 MG/ML IJ SOLN
30.0000 mg | Freq: Once | INTRAMUSCULAR | Status: AC
  Administered 2014-03-17: 30 mg via INTRAVENOUS
  Filled 2014-03-17: qty 1

## 2014-03-17 MED ORDER — ONDANSETRON 4 MG PO TBDP
4.0000 mg | ORAL_TABLET | Freq: Once | ORAL | Status: AC
  Administered 2014-03-17: 4 mg via ORAL
  Filled 2014-03-17: qty 1

## 2014-03-17 MED ORDER — IPRATROPIUM-ALBUTEROL 0.5-2.5 (3) MG/3ML IN SOLN
3.0000 mL | Freq: Once | RESPIRATORY_TRACT | Status: AC
  Administered 2014-03-17: 3 mL via RESPIRATORY_TRACT
  Filled 2014-03-17: qty 3

## 2014-03-17 MED ORDER — SODIUM CHLORIDE 0.9 % IV SOLN: INTRAVENOUS | Status: DC

## 2014-03-17 NOTE — ED Provider Notes (Signed)
CSN: [id number]     Arrival date & time 03/17/14  1126 History   First MD Initiated Contact with Patient 03/17/14 1200     Chief Complaint  Patient presents with  . Cough     (Consider location/radiation/quality/duration/timing/severity/associated sxs/prior Treatment) Patient is a 33 y.o. female presenting with cough. The history is provided by the patient.  Cough Associated symptoms: fever, headaches and myalgias   Associated symptoms: no rash    patient was sick exposure through her boyfriend. Patient has a history of Crohn's disease. Patient with cough and fever since Thursday. Associated with nausea vomiting and diarrhea no extensive diarrhea. No blood in the vomit or the diarrhea. Nausea has persisted. Cough is productive. Clear in nature. Symptoms started on Tuesday not Thursday correction.  Past Medical History  Diagnosis Date  . Crohn's disease   . Psoriasis    Past Surgical History  Procedure Laterality Date  . Ileostomy    . Nephrostomy     Family History  Problem Relation Age of Onset  . Hyperlipidemia Father   . Hypertension Father   . Diabetes Neg Hx   . Heart attack Neg Hx   . Sudden death Neg Hx    History  Substance Use Topics  . Smoking status: Current Every Day Smoker -- 0.50 packs/day    Types: Cigarettes  . Smokeless tobacco: Never Used  . Alcohol Use: No     Comment: occ   OB History   Grav Para Term Preterm Abortions TAB SAB Ect Mult Living                 Review of Systems  Constitutional: Positive for fever.  HENT: Positive for congestion.   Eyes: Negative for redness.  Respiratory: Positive for cough.   Gastrointestinal: Positive for nausea, vomiting and diarrhea.  Genitourinary: Negative for dysuria.  Musculoskeletal: Positive for myalgias.  Skin: Negative for rash.  Neurological: Positive for headaches.  Hematological: Does not bruise/bleed easily.  Psychiatric/Behavioral: Negative for confusion.      Allergies  Chocolate;  Aspirin; and Ibuprofen  Home Medications   Prior to Admission medications   Medication Sig Start Date End Date Taking? Authorizing Provider  acetaminophen (TYLENOL) 325 MG tablet Take 650 mg by mouth every 6 (six) hours as needed.   Yes Historical Provider, MD  ALPRAZolam Duanne Moron) 0.5 MG tablet Take 0.5 mg by mouth 2 (two) times daily as needed for anxiety.   Yes Historical Provider, MD  ondansetron (ZOFRAN-ODT) 4 MG disintegrating tablet Take 4 mg by mouth every 8 (eight) hours as needed for nausea or vomiting.   Yes Historical Provider, MD  cephALEXin (KEFLEX) 500 MG capsule Take 1 capsule (500 mg total) by mouth 4 (four) times daily. 10/14/13   Shari A Upstill, PA-C  dextromethorphan-guaiFENesin (MUCINEX DM) 30-600 MG per 12 hr tablet Take 1 tablet by mouth 2 (two) times daily. 03/17/14   Fredia Sorrow, MD  levofloxacin (LEVAQUIN) 750 MG tablet Take 750 mg by mouth daily.    Historical Provider, MD  metroNIDAZOLE (FLAGYL) 500 MG tablet Take 500 mg by mouth 3 (three) times daily.    Historical Provider, MD  ondansetron (ZOFRAN ODT) 4 MG disintegrating tablet Take 1 tablet (4 mg total) by mouth every 8 (eight) hours as needed for nausea or vomiting. 03/17/14   Fredia Sorrow, MD  oxyCODONE-acetaminophen (PERCOCET/ROXICET) 5-325 MG per tablet Take by mouth every 4 (four) hours as needed for severe pain.    Historical Provider, MD  oxyCODONE-acetaminophen (PERCOCET/ROXICET)  5-325 MG per tablet Take 1-2 tablets by mouth every 4 (four) hours as needed for severe pain. 10/14/13   Shari A Upstill, PA-C  traMADol (ULTRAM) 50 MG tablet Take 1 tablet (50 mg total) by mouth every 6 (six) hours as needed. 03/17/14   Fredia Sorrow, MD   BP 91/60  Pulse 97  Temp(Src) 98.1 F (36.7 C) (Oral)  Resp 20  Ht 5\' 7"  (1.702 m)  Wt 174 lb (78.926 kg)  BMI 27.25 kg/m2  SpO2 100%  LMP 03/09/2014 Physical Exam  Nursing note and vitals reviewed. Constitutional: She is oriented to person, place, and time. She  appears well-developed and well-nourished. No distress.  HENT:  Head: Normocephalic and atraumatic.  Mouth/Throat: Oropharynx is clear and moist.  Eyes: Conjunctivae and EOM are normal. Pupils are equal, round, and reactive to light.  Neck: Normal range of motion.  Cardiovascular: Normal rate, regular rhythm and normal heart sounds.   No murmur heard. Pulmonary/Chest: Breath sounds normal. She is in respiratory distress.  Abdominal: Soft. Bowel sounds are normal. There is no tenderness.  Musculoskeletal: Normal range of motion.  Lymphadenopathy:    She has no cervical adenopathy.  Neurological: She is alert and oriented to person, place, and time. No cranial nerve deficit. She exhibits normal muscle tone. Coordination normal.  Skin: Skin is warm. No rash noted.    ED Course  Procedures (including critical care time) Labs Review Labs Reviewed  BASIC METABOLIC PANEL - Abnormal; Notable for the following:    Glucose, Bld 100 (*)    Anion gap 17 (*)    All other components within normal limits  CBC WITH DIFFERENTIAL - Abnormal; Notable for the following:    Platelets 457 (*)    All other components within normal limits    Imaging Review Dg Chest 2 View  03/17/2014   CLINICAL DATA:  Five day history of cough and fever  EXAM: CHEST  2 VIEW  COMPARISON:  September 20, 2012  FINDINGS: Lungs are clear. Heart size and pulmonary vascularity are normal. No adenopathy. No pneumothorax. No bone lesions. There is slight upper lumbar dextroscoliosis.  IMPRESSION: No edema or consolidation.   Electronically Signed   By: Lowella Grip M.D.   On: 03/17/2014 12:37     EKG Interpretation None      MDM   Final diagnoses:  Flu-like symptoms  Gastroenteritis    Patient with multiple symptoms consistent with upper rest for infection and a gastroenteritis. Making this most likely a viral type illness. Patient does have a sick exposure. Person with similar symptoms. Today's workup a chest x-ray  negative for pneumonia. Labs without significant abnormalities.   Results for orders placed during the hospital encounter of 11/91/47  BASIC METABOLIC PANEL      Result Value Ref Range   Sodium 138  137 - 147 mEq/L   Potassium 3.9  3.7 - 5.3 mEq/L   Chloride 101  96 - 112 mEq/L   CO2 20  19 - 32 mEq/L   Glucose, Bld 100 (*) 70 - 99 mg/dL   BUN 8  6 - 23 mg/dL   Creatinine, Ser 0.50  0.50 - 1.10 mg/dL   Calcium 9.8  8.4 - 10.5 mg/dL   GFR calc non Af Amer >90  >90 mL/min   GFR calc Af Amer >90  >90 mL/min   Anion gap 17 (*) 5 - 15  CBC WITH DIFFERENTIAL      Result Value Ref Range  WBC 10.1  4.0 - 10.5 K/uL   RBC 4.17  3.87 - 5.11 MIL/uL   Hemoglobin 13.1  12.0 - 15.0 g/dL   HCT 38.5  36.0 - 46.0 %   MCV 92.3  78.0 - 100.0 fL   MCH 31.4  26.0 - 34.0 pg   MCHC 34.0  30.0 - 36.0 g/dL   RDW 13.9  11.5 - 15.5 %   Platelets 457 (*) 150 - 400 K/uL   Neutrophils Relative % 64  43 - 77 %   Neutro Abs 6.5  1.7 - 7.7 K/uL   Lymphocytes Relative 27  12 - 46 %   Lymphs Abs 2.7  0.7 - 4.0 K/uL   Monocytes Relative 7  3 - 12 %   Monocytes Absolute 0.7  0.1 - 1.0 K/uL   Eosinophils Relative 2  0 - 5 %   Eosinophils Absolute 0.2  0.0 - 0.7 K/uL   Basophils Relative 0  0 - 1 %   Basophils Absolute 0.0  0.0 - 0.1 K/uL    Patient improved here with a fluids 1 L of IV normal saline. Patient treated with Zofran and treated with Toradol some improvement in the symptoms. We'll continue with Zofran at home for the nausea and vomiting no significant diarrhea going on. We'll have patient take Mucinex DM for the cough. And the patient to tramadol. Take Tylenol for the fever.   Fredia Sorrow, MD 03/17/14 1404

## 2014-03-17 NOTE — ED Notes (Signed)
Cough and fever since thursday

## 2014-03-17 NOTE — Discharge Instructions (Signed)
Rest at home. Taking Mucinex DM as needed for the productive cough. Take Zofran as needed for nausea. Take the tramadol as needed for bodyaches. Return for any newer worse symptoms.

## 2014-03-17 NOTE — ED Notes (Signed)
Rx x 3 given for mucinex-dm, tramadol and zofran- pt d/c with family- alert, ambulated to d/c window with steady gait-

## 2014-08-14 ENCOUNTER — Emergency Department (HOSPITAL_BASED_OUTPATIENT_CLINIC_OR_DEPARTMENT_OTHER)
Admission: EM | Admit: 2014-08-14 | Discharge: 2014-08-14 | Disposition: A | Discharge status: Home / Self Care | Payer: Medicare Other | Attending: Emergency Medicine | Admitting: Emergency Medicine

## 2014-08-14 ENCOUNTER — Encounter (HOSPITAL_BASED_OUTPATIENT_CLINIC_OR_DEPARTMENT_OTHER): Payer: Self-pay | Admitting: *Deleted

## 2014-08-14 DIAGNOSIS — Z8719 Personal history of other diseases of the digestive system: Secondary | ICD-10-CM | POA: Insufficient documentation

## 2014-08-14 DIAGNOSIS — L02411 Cutaneous abscess of right axilla: Secondary | ICD-10-CM | POA: Diagnosis present

## 2014-08-14 DIAGNOSIS — Z720 Tobacco use: Secondary | ICD-10-CM | POA: Insufficient documentation

## 2014-08-14 DIAGNOSIS — Z79899 Other long term (current) drug therapy: Secondary | ICD-10-CM | POA: Diagnosis not present

## 2014-08-14 DIAGNOSIS — Z792 Long term (current) use of antibiotics: Secondary | ICD-10-CM | POA: Insufficient documentation

## 2014-08-14 DIAGNOSIS — L0291 Cutaneous abscess, unspecified: Secondary | ICD-10-CM

## 2014-08-14 HISTORY — DX: Noninfective gastroenteritis and colitis, unspecified: K52.9

## 2014-08-14 MED ORDER — OXYCODONE-ACETAMINOPHEN 5-325 MG PO TABS
1.0000 | ORAL_TABLET | Freq: Once | ORAL | Status: AC
  Administered 2014-08-14: 1 via ORAL
  Filled 2014-08-14: qty 1

## 2014-08-14 MED ORDER — OXYCODONE-ACETAMINOPHEN 5-325 MG PO TABS: 1.0000 | ORAL_TABLET | Freq: Four times a day (QID) | ORAL | Status: DC | PRN

## 2014-08-14 MED ORDER — LIDOCAINE-EPINEPHRINE 2 %-1:100000 IJ SOLN
20.0000 mL | Freq: Once | INTRAMUSCULAR | Status: AC
  Administered 2014-08-14: 20 mL
  Filled 2014-08-14: qty 1

## 2014-08-14 NOTE — ED Notes (Signed)
Pt c/o abscess in right axilla x4 days. No drainage.

## 2014-08-14 NOTE — ED Provider Notes (Signed)
CSN: [id number]     Arrival date & time 08/14/14  2505 History   First MD Initiated Contact with Patient 08/14/14 [phone]     Chief Complaint  Patient presents with  . Abscess     (Consider location/radiation/quality/duration/timing/severity/associated sxs/prior Treatment) Patient is a 34 y.o. female presenting with abscess. The history is provided by the patient.  Abscess Abscess location: right axilla. Size:  3x3 cm Abscess quality: induration, painful and warmth   Red streaking: no   Duration:  4 days Progression:  Worsening Pain details:    Quality:  Dull   Severity:  Mild   Duration:  4 days   Timing:  Constant   Progression:  Worsening Chronicity:  Recurrent Context comment:  Spontaneously Relieved by:  Nothing Worsened by:  Nothing tried Ineffective treatments:  None tried Associated symptoms: no fatigue, no fever, no headaches, no nausea and no vomiting     Past Medical History  Diagnosis Date  . Crohn's disease   . Psoriasis   . Gastroenteritis    Past Surgical History  Procedure Laterality Date  . Ileostomy    . Nephrostomy    . Kidney stent     Family History  Problem Relation Age of Onset  . Hyperlipidemia Father   . Hypertension Father   . Diabetes Neg Hx   . Heart attack Neg Hx   . Sudden death Neg Hx    History  Substance Use Topics  . Smoking status: Current Every Day Smoker -- 0.50 packs/day    Types: Cigarettes  . Smokeless tobacco: Never Used  . Alcohol Use: No     Comment: occ   OB History    No data available     Review of Systems  Constitutional: Negative for fever and fatigue.  HENT: Negative for congestion and drooling.   Eyes: Negative for pain.  Respiratory: Negative for cough and shortness of breath.   Cardiovascular: Negative for chest pain.  Gastrointestinal: Negative for nausea, vomiting, abdominal pain and diarrhea.  Genitourinary: Negative for dysuria and hematuria.  Musculoskeletal: Negative for back pain, gait  problem and neck pain.  Skin: Negative for color change.  Neurological: Negative for dizziness and headaches.  Hematological: Negative for adenopathy.  Psychiatric/Behavioral: Negative for behavioral problems.  All other systems reviewed and are negative.     Allergies  Chocolate; Aspirin; and Ibuprofen  Home Medications   Prior to Admission medications   Medication Sig Start Date End Date Taking? Authorizing Provider  cholecalciferol (VITAMIN D) 400 UNITS TABS tablet Take 400 Units by mouth.   Yes Historical Provider, MD  esomeprazole (NEXIUM) 20 MG capsule Take 20 mg by mouth daily at 12 noon.   Yes Historical Provider, MD  zolpidem (AMBIEN) 5 MG tablet Take 5 mg by mouth at bedtime as needed for sleep.   Yes Historical Provider, MD  acetaminophen (TYLENOL) 325 MG tablet Take 650 mg by mouth every 6 (six) hours as needed.    Historical Provider, MD  ALPRAZolam Duanne Moron) 0.5 MG tablet Take 0.5 mg by mouth 2 (two) times daily as needed for anxiety.    Historical Provider, MD  cephALEXin (KEFLEX) 500 MG capsule Take 1 capsule (500 mg total) by mouth 4 (four) times daily. 10/14/13   Charlann Lange, PA-C  dextromethorphan-guaiFENesin (MUCINEX DM) 30-600 MG per 12 hr tablet Take 1 tablet by mouth 2 (two) times daily. 03/17/14   Fredia Sorrow, MD  levofloxacin (LEVAQUIN) 750 MG tablet Take 750 mg by mouth daily.  Historical Provider, MD  metroNIDAZOLE (FLAGYL) 500 MG tablet Take 500 mg by mouth 3 (three) times daily.    Historical Provider, MD  ondansetron (ZOFRAN ODT) 4 MG disintegrating tablet Take 1 tablet (4 mg total) by mouth every 8 (eight) hours as needed for nausea or vomiting. 03/17/14   Fredia Sorrow, MD  ondansetron (ZOFRAN-ODT) 4 MG disintegrating tablet Take 4 mg by mouth every 8 (eight) hours as needed for nausea or vomiting.    Historical Provider, MD  oxyCODONE-acetaminophen (PERCOCET/ROXICET) 5-325 MG per tablet Take by mouth every 4 (four) hours as needed for severe pain.     Historical Provider, MD  oxyCODONE-acetaminophen (PERCOCET/ROXICET) 5-325 MG per tablet Take 1-2 tablets by mouth every 4 (four) hours as needed for severe pain. 10/14/13   Charlann Lange, PA-C  traMADol (ULTRAM) 50 MG tablet Take 1 tablet (50 mg total) by mouth every 6 (six) hours as needed. 03/17/14   Fredia Sorrow, MD   BP 104/75 mmHg  Pulse 94  Temp(Src) 98 F (36.7 C) (Oral)  Resp 18  Ht 5\' 7"  (1.702 m)  Wt 192 lb (87.091 kg)  BMI 30.06 kg/m2  SpO2 98%  LMP 08/04/2014 Physical Exam  Constitutional: She is oriented to person, place, and time. She appears well-developed and well-nourished.  HENT:  Head: Normocephalic.  Mouth/Throat: Oropharynx is clear and moist. No oropharyngeal exudate.  Eyes: Conjunctivae and EOM are normal. Pupils are equal, round, and reactive to light.  Neck: Normal range of motion. Neck supple.  Cardiovascular: Normal rate, regular rhythm, normal heart sounds and intact distal pulses.  Exam reveals no gallop and no friction rub.   No murmur heard. Pulmonary/Chest: Effort normal and breath sounds normal. No respiratory distress. She has no wheezes.  Abdominal: Soft. Bowel sounds are normal. There is no tenderness. There is no rebound and no guarding.  Musculoskeletal: Normal range of motion. She exhibits tenderness. She exhibits no edema.  3 x 3 cm area of mild induration and erythema noted in the right axilla.  Neurological: She is alert and oriented to person, place, and time.  Skin: Skin is warm and dry.  Psychiatric: She has a normal mood and affect. Her behavior is normal.  Nursing note and vitals reviewed.   ED Course  INCISION AND DRAINAGE Date/Time: 08/14/2014 9:30 AM Performed by: Pamella Pert Authorized by: Pamella Pert Consent: Verbal consent obtained. Written consent not obtained. Risks and benefits: risks, benefits and alternatives were discussed Consent given by: patient Patient understanding: patient states understanding of  the procedure being performed Required items: required blood products, implants, devices, and special equipment available Patient identity confirmed: verbally with patient, arm band, provided demographic data and hospital-assigned identification number Time out: Immediately prior to procedure a "time out" was called to verify the correct patient, procedure, equipment, support staff and site/side marked as required. Type: abscess Location: right axilla. Anesthesia: local infiltration Local anesthetic: lidocaine 2% with epinephrine Anesthetic total: 3 ml Patient sedated: no Scalpel size: 11 Incision type: single straight Complexity: simple Drainage: purulent Drainage amount: moderate Wound treatment: wound left open Packing material: none Patient tolerance: Patient tolerated the procedure well with no immediate complications   (including critical care time) Labs Review Labs Reviewed - No data to display  Imaging Review No results found.   EKG Interpretation None      MDM   Final diagnoses:  Abscess    8:47 AM 34 y.o. female with a history of Crohn's disease who presents with a small abscess which  developed in the right axilla about 4 days ago. No drainage. No fevers. Vital signs unremarkable here. She does have a history of labial and axilla abscesses. Will perform I and D.  9:40 AM: Pt tolerated I/D well.  I have discussed the diagnosis/risks/treatment options with the patient and believe the pt to be eligible for discharge home to follow-up with w/ her pcp as needed. We also discussed returning to the ED immediately if new or worsening sx occur. We discussed the sx which are most concerning (e.g., spreading redness, worsening pain, fever) that necessitate immediate return. Medications administered to the patient during their visit and any new prescriptions provided to the patient are listed below.  Medications given during this visit Medications  lidocaine-EPINEPHrine  (XYLOCAINE W/EPI) 2 %-1:100000 (with pres) injection 20 mL (20 mLs Infiltration Given by Other 08/14/14 0853)  oxyCODONE-acetaminophen (PERCOCET/ROXICET) 5-325 MG per tablet 1 tablet (1 tablet Oral Given 08/14/14 0927)    New Prescriptions   OXYCODONE-ACETAMINOPHEN (PERCOCET) 5-325 MG PER TABLET    Take 1 tablet by mouth every 6 (six) hours as needed.     Pamella Pert, MD 08/14/14 [phone]

## 2014-08-14 NOTE — ED Notes (Signed)
MD at bedside. 

## 2014-08-14 NOTE — Discharge Instructions (Signed)
Abscess °An abscess (boil or furuncle) is an infected area on or under the skin. This area is filled with yellowish-white fluid (pus) and other material (debris). °HOME CARE  °· Only take medicines as told by your doctor. °· If you were given antibiotic medicine, take it as directed. Finish the medicine even if you start to feel better. °· If gauze is used, follow your doctor's directions for changing the gauze. °· To avoid spreading the infection: °¨ Keep your abscess covered with a bandage. °¨ Wash your hands well. °¨ Do not share personal care items, towels, or whirlpools with others. °¨ Avoid skin contact with others. °· Keep your skin and clothes clean around the abscess. °· Keep all doctor visits as told. °GET HELP RIGHT AWAY IF:  °· You have more pain, puffiness (swelling), or redness in the wound site. °· You have more fluid or blood coming from the wound site. °· You have muscle aches, chills, or you feel sick. °· You have a fever. °MAKE SURE YOU:  °· Understand these instructions. °· Will watch your condition. °· Will get help right away if you are not doing well or get worse. °Document Released: 11/10/2007 Document Revised: 11/23/2011 Document Reviewed: 08/06/2011 °ExitCare® Patient Information ©2015 [employer]. This information is not intended to replace advice given to you by your health care provider. Make sure you discuss any questions you have with your health care provider. ° °

## 2015-04-22 ENCOUNTER — Emergency Department (HOSPITAL_BASED_OUTPATIENT_CLINIC_OR_DEPARTMENT_OTHER)
Admission: EM | Admit: 2015-04-22 | Discharge: 2015-04-22 | Disposition: A | Discharge status: Home / Self Care | Payer: Medicare Other | Attending: Emergency Medicine | Admitting: Emergency Medicine

## 2015-04-22 ENCOUNTER — Encounter (HOSPITAL_BASED_OUTPATIENT_CLINIC_OR_DEPARTMENT_OTHER): Payer: Self-pay | Admitting: *Deleted

## 2015-04-22 DIAGNOSIS — F17210 Nicotine dependence, cigarettes, uncomplicated: Secondary | ICD-10-CM | POA: Diagnosis not present

## 2015-04-22 DIAGNOSIS — L732 Hidradenitis suppurativa: Secondary | ICD-10-CM | POA: Diagnosis not present

## 2015-04-22 DIAGNOSIS — Z79899 Other long term (current) drug therapy: Secondary | ICD-10-CM | POA: Diagnosis not present

## 2015-04-22 DIAGNOSIS — Z8719 Personal history of other diseases of the digestive system: Secondary | ICD-10-CM | POA: Diagnosis not present

## 2015-04-22 DIAGNOSIS — N764 Abscess of vulva: Secondary | ICD-10-CM | POA: Diagnosis not present

## 2015-04-22 DIAGNOSIS — N9089 Other specified noninflammatory disorders of vulva and perineum: Secondary | ICD-10-CM | POA: Diagnosis present

## 2015-04-22 MED ORDER — DOXYCYCLINE HYCLATE 100 MG PO CAPS: 100.0000 mg | ORAL_CAPSULE | Freq: Two times a day (BID) | ORAL | Status: DC

## 2015-04-22 MED ORDER — ONDANSETRON HCL 4 MG/2ML IJ SOLN
4.0000 mg | Freq: Once | INTRAMUSCULAR | Status: AC
  Administered 2015-04-22: 4 mg via INTRAVENOUS
  Filled 2015-04-22: qty 2

## 2015-04-22 MED ORDER — CEFAZOLIN SODIUM 1-5 GM-% IV SOLN
INTRAVENOUS | Status: AC
  Administered 2015-04-22: 2000 mg
  Filled 2015-04-22: qty 50

## 2015-04-22 MED ORDER — FENTANYL CITRATE (PF) 100 MCG/2ML IJ SOLN
50.0000 ug | INTRAMUSCULAR | Status: DC | PRN
  Administered 2015-04-22: 50 ug via INTRAVENOUS
  Filled 2015-04-22: qty 2

## 2015-04-22 MED ORDER — LIDOCAINE-EPINEPHRINE 2 %-1:100000 IJ SOLN
INTRAMUSCULAR | Status: AC
  Administered 2015-04-22: 13:00:00
  Filled 2015-04-22: qty 1

## 2015-04-22 MED ORDER — CEFAZOLIN SODIUM 1-5 GM-% IV SOLN
INTRAVENOUS | Status: AC
  Administered 2015-04-22: 1000 mg
  Filled 2015-04-22: qty 50

## 2015-04-22 MED ORDER — HYDROCODONE-ACETAMINOPHEN 5-325 MG PO TABS: 2.0000 | ORAL_TABLET | ORAL | Status: DC | PRN

## 2015-04-22 MED ORDER — DEXTROSE 5 % IV SOLN: 2.0000 g | Freq: Once | INTRAVENOUS | Status: DC

## 2015-04-22 NOTE — ED Notes (Signed)
Pt reports hx of multiple abcesses to her labia, states "they usually drain with my menstruation, but this month they didn't". Pt reports swelling, pain and tenderness to left labia, labia is swollen and red, also to right labia.

## 2015-04-22 NOTE — ED Notes (Signed)
md at bedside for I/D

## 2015-04-22 NOTE — Discharge Instructions (Signed)
Remove 1 inch of gauze per day until all the gauze comes out. After the gauzes out, begin warm soaks 2-3 times per day with gentle massage of the labia  Hidradenitis Suppurativa Hidradenitis suppurativa is a long-term (chronic) skin disease that starts with blocked sweat glands or hair follicles. Bacteria may grow in these blocked openings of your skin. Hidradenitis suppurativa is like a severe form of acne that develops in areas of your body where acne would be unusual. It is most likely to affect the areas of your body where skin rubs against skin and becomes moist. This includes your:  Underarms.  Groin.  Genital areas.  Buttocks.  Upper thighs.  Breasts. Hidradenitis suppurativa may start out with small pimples. The pimples can develop into deep sores that break open (rupture) and drain pus. Over time your skin may thicken and become scarred. Hidradenitis suppurativa cannot be passed from person to person.  CAUSES  The exact cause of hidradenitis suppurativa is not known. This condition may be due to:  Female and female hormones. The condition is rare before and after puberty.  An overactive body defense system (immune system). Your immune system may overreact to the blocked hair follicles or sweat glands and cause swelling and pus-filled sores. RISK FACTORS You may have a higher risk of hidradenitis suppurativa if you:  Are a woman.  Are between ages 67 and 20.  Have a family history of hidradenitis suppurativa.  Have a personal history of acne.  Are overweight.  Smoke.  Take the drug lithium. SIGNS AND SYMPTOMS  The first signs of an outbreak are usually painful skin bumps that look like pimples. As the condition progresses:  Skin bumps may get bigger and grow deeper into the skin.  Bumps under the skin may rupture and drain smelly pus.  Skin may become itchy and infected.  Skin may thicken and scar.  Drainage may continue through tunnels under the skin  (fistulas).  Walking and moving your arms can become painful. DIAGNOSIS  Your health care provider may diagnose hidradenitis suppurativa based on your medical history and your signs and symptoms. A physical exam will also be done. You may need to see a health care provider who specializes in skin diseases (dermatologist). You may also have tests done to confirm the diagnosis. These can include:  Swabbing a sample of pus or drainage from your skin so it can be sent to the lab and tested for infection.  Blood tests to check for infection. TREATMENT  The same treatment will not work for everybody with hidradenitis suppurativa. Your treatment will depend on how severe your symptoms are. You may need to try several treatments to find what works best for you. Part of your treatment may include cleaning and bandaging (dressing) your wounds. You may also have to take medicines, such as the following:  Antibiotics.  Acne medicines.  Medicines to block or suppress the immune system.  A diabetes medicine (metformin) is sometimes used to treat this condition.  For women, birth control pills can sometimes help relieve symptoms. You may need surgery if you have a severe case of hidradenitis suppurativa that does not respond to medicine. Surgery may involve:   Using a laser to clear the skin and remove hair follicles.  Opening and draining deep sores.  Removing the areas of skin that are diseased and scarred. HOME CARE INSTRUCTIONS  Learn as much as you can about your disease, and work closely with your health care providers.  Take  medicines only as directed by your health care provider.  If you were prescribed an antibiotic medicine, finish it all even if you start to feel better.  If you are overweight, losing weight may be very helpful. Try to reach and maintain a healthy weight.  Do not use any tobacco products, including cigarettes, chewing tobacco, or electronic cigarettes. If you need  help quitting, ask your health care provider.  Do not shave the areas where you get hidradenitis suppurativa.  Do not wear deodorant.  Wear loose-fitting clothes.  Try not to overheat and get sweaty.  Take a daily bleach bath as directed by your health care provider.  Fill your bathtub halfway with water.  Pour in  cup of unscented household bleach.  Soak for 5-10 minutes.  Cover sore areas with a warm, clean washcloth (compress) for 5-10 minutes. SEEK MEDICAL CARE IF:   You have a flare-up of hidradenitis suppurativa.  You have chills or a fever.  You are having trouble controlling your symptoms at home.   This information is not intended to replace advice given to you by your health care provider. Make sure you discuss any questions you have with your health care provider.   Document Released: 01/06/2004 Document Revised: 06/14/2014 Document Reviewed: 08/24/2013 Elsevier Interactive Patient Education [employer].

## 2015-04-22 NOTE — ED Provider Notes (Signed)
CSN: [id number]     Arrival date & time 04/22/15  0915 History   First MD Initiated Contact with Patient 04/22/15 [phone]     Chief Complaint  Patient presents with  . Skin Problem      HPI  Patient presents with pain and swelling of her left labia. Has a history of hidradenitis supra T the previous multiple labial abscess incision and drainage procedures. Sickness last 4-5 days of progressive swelling of labia she presents here.  Past Medical History  Diagnosis Date  . Crohn's disease ([place])   . Psoriasis   . Gastroenteritis    Past Surgical History  Procedure Laterality Date  . Ileostomy    . Nephrostomy    . Kidney stent     Family History  Problem Relation Age of Onset  . Hyperlipidemia Father   . Hypertension Father   . Diabetes Neg Hx   . Heart attack Neg Hx   . Sudden death Neg Hx    Social History  Substance Use Topics  . Smoking status: Current Every Day Smoker -- 0.50 packs/day    Types: Cigarettes  . Smokeless tobacco: Never Used  . Alcohol Use: No     Comment: occ   OB History    No data available     Review of Systems  Constitutional: Negative for fever, chills, diaphoresis, appetite change and fatigue.  HENT: Negative for mouth sores, sore throat and trouble swallowing.   Eyes: Negative for visual disturbance.  Respiratory: Negative for cough, chest tightness, shortness of breath and wheezing.   Cardiovascular: Negative for chest pain.  Gastrointestinal: Negative for nausea, vomiting, abdominal pain, diarrhea and abdominal distention.  Endocrine: Negative for polydipsia, polyphagia and polyuria.  Genitourinary: Positive for vaginal pain. Negative for dysuria, frequency and hematuria.  Musculoskeletal: Negative for gait problem.  Skin: Negative for color change, pallor and rash.  Neurological: Negative for dizziness, syncope, light-headedness and headaches.  Hematological: Does not bruise/bleed easily.  Psychiatric/Behavioral: Negative for  behavioral problems and confusion.      Allergies  Chocolate; Aspirin; and Ibuprofen  Home Medications   Prior to Admission medications   Medication Sig Start Date End Date Taking? Authorizing Provider  Multiple Vitamin (MULTIVITAMIN) tablet Take 1 tablet by mouth daily.   Yes Historical Provider, MD  acetaminophen (TYLENOL) 325 MG tablet Take 650 mg by mouth every 6 (six) hours as needed.    Historical Provider, MD  ALPRAZolam Duanne Moron) 0.5 MG tablet Take 0.5 mg by mouth 2 (two) times daily as needed for anxiety.    Historical Provider, MD  cholecalciferol (VITAMIN D) 400 UNITS TABS tablet Take 400 Units by mouth.    Historical Provider, MD  dextromethorphan-guaiFENesin (MUCINEX DM) 30-600 MG per 12 hr tablet Take 1 tablet by mouth 2 (two) times daily. 03/17/14   Fredia Sorrow, MD  doxycycline (VIBRAMYCIN) 100 MG capsule Take 1 capsule (100 mg total) by mouth 2 (two) times daily. 04/22/15   Tanna Furry, MD  esomeprazole (NEXIUM) 20 MG capsule Take 20 mg by mouth daily at 12 noon.    Historical Provider, MD  HYDROcodone-acetaminophen (NORCO/VICODIN) 5-325 MG tablet Take 2 tablets by mouth every 4 (four) hours as needed. 04/22/15   Tanna Furry, MD  zolpidem (AMBIEN) 5 MG tablet Take 5 mg by mouth at bedtime as needed for sleep.    Historical Provider, MD   BP 104/64 mmHg  Pulse 82  Temp(Src) 98 F (36.7 C) (Oral)  Resp 20  Ht 5\' 7"  (1.702  m)  Wt 173 lb (78.472 kg)  BMI 27.09 kg/m2  SpO2 100%  LMP 04/17/2015 Physical Exam  Constitutional: She is oriented to person, place, and time. She appears well-developed and well-nourished. No distress.  HENT:  Head: Normocephalic.  Eyes: Conjunctivae are normal. Pupils are equal, round, and reactive to light. No scleral icterus.  Neck: Normal range of motion. Neck supple. No thyromegaly present.  Cardiovascular: Normal rate and regular rhythm.  Exam reveals no gallop and no friction rub.   No murmur heard. Pulmonary/Chest: Effort normal and  breath sounds normal. No respiratory distress. She has no wheezes. She has no rales.  Abdominal: Soft. Bowel sounds are normal. She exhibits no distension. There is no tenderness. There is no rebound.  Genitourinary:     Musculoskeletal: Normal range of motion.  Neurological: She is alert and oriented to person, place, and time.  Skin: Skin is warm and dry. No rash noted.  Psychiatric: She has a normal mood and affect. Her behavior is normal.    ED Course  Procedures (including critical care time) Labs Review Labs Reviewed - No data to display  Imaging Review No results found. I have personally reviewed and evaluated these images and lab results as part of my medical decision-making.   EKG Interpretation None      MDM   Final diagnoses:  Hidradenitis suppurativa  Labial abscess    INCISION AND DRAINAGE Performed by: Lolita Patella Consent: Verbal consent obtained. Risks and benefits: risks, benefits and alternatives were discussed Type: abscess  Body area: left labia  Anesthesia: local infiltration  Incision was made with a scalpel.  Local anesthetic: lidocaine 1% c epinephrine  Anesthetic total: 4 ml  Complexity: complex Blunt dissection to break up loculations  Drainage: purulent  Drainage amount: moderate  Packing material: 1/4 in iodoform gauze  Patient tolerance: Patient tolerated the procedure well with no immediate complications.       Tanna Furry, MD 04/22/15 [phone]

## 2015-06-23 ENCOUNTER — Emergency Department (HOSPITAL_BASED_OUTPATIENT_CLINIC_OR_DEPARTMENT_OTHER)
Admission: EM | Admit: 2015-06-23 | Discharge: 2015-06-23 | Disposition: A | Discharge status: Home / Self Care | Payer: Medicare Other | Attending: Emergency Medicine | Admitting: Emergency Medicine

## 2015-06-23 ENCOUNTER — Encounter (HOSPITAL_BASED_OUTPATIENT_CLINIC_OR_DEPARTMENT_OTHER): Payer: Self-pay | Admitting: *Deleted

## 2015-06-23 ENCOUNTER — Emergency Department (HOSPITAL_BASED_OUTPATIENT_CLINIC_OR_DEPARTMENT_OTHER): Payer: Medicare Other

## 2015-06-23 DIAGNOSIS — M25471 Effusion, right ankle: Secondary | ICD-10-CM | POA: Insufficient documentation

## 2015-06-23 DIAGNOSIS — Z79899 Other long term (current) drug therapy: Secondary | ICD-10-CM | POA: Insufficient documentation

## 2015-06-23 DIAGNOSIS — R202 Paresthesia of skin: Secondary | ICD-10-CM | POA: Insufficient documentation

## 2015-06-23 DIAGNOSIS — M25571 Pain in right ankle and joints of right foot: Secondary | ICD-10-CM | POA: Insufficient documentation

## 2015-06-23 DIAGNOSIS — Z872 Personal history of diseases of the skin and subcutaneous tissue: Secondary | ICD-10-CM | POA: Insufficient documentation

## 2015-06-23 DIAGNOSIS — Z8719 Personal history of other diseases of the digestive system: Secondary | ICD-10-CM | POA: Insufficient documentation

## 2015-06-23 DIAGNOSIS — Z87828 Personal history of other (healed) physical injury and trauma: Secondary | ICD-10-CM | POA: Insufficient documentation

## 2015-06-23 DIAGNOSIS — F17210 Nicotine dependence, cigarettes, uncomplicated: Secondary | ICD-10-CM | POA: Insufficient documentation

## 2015-06-23 DIAGNOSIS — G8929 Other chronic pain: Secondary | ICD-10-CM | POA: Diagnosis not present

## 2015-06-23 MED ORDER — ACETAMINOPHEN 325 MG PO TABS
650.0000 mg | ORAL_TABLET | Freq: Once | ORAL | Status: AC
  Administered 2015-06-23: 650 mg via ORAL
  Filled 2015-06-23: qty 2

## 2015-06-23 MED ORDER — HYDROCODONE-ACETAMINOPHEN 5-325 MG PO TABS
1.0000 | ORAL_TABLET | Freq: Once | ORAL | Status: AC
  Administered 2015-06-23: 1 via ORAL
  Filled 2015-06-23: qty 1

## 2015-06-23 MED ORDER — TRAMADOL HCL 50 MG PO TABS: 50.0000 mg | ORAL_TABLET | Freq: Four times a day (QID) | ORAL | Status: DC | PRN

## 2015-06-23 MED FILL — traMADol HCL 50 MG TABS: 50 | 4 days supply | Qty: 15 | Fill #0

## 2015-06-23 NOTE — ED Notes (Signed)
Visitor states pt needs a narcotic

## 2015-06-23 NOTE — ED Provider Notes (Signed)
CSN: [id number]     Arrival date & time 06/23/15  1027 History   First MD Initiated Contact with Patient 06/23/15 1040     Chief Complaint  Patient presents with  . Ankle Pain     (Consider location/radiation/quality/duration/timing/severity/associated sxs/prior Treatment) HPI   Patient's a 35 year old female with past medical history of Crohn's and psoriasis who presents to the ED with complaint of right ankle pain. She notes she injured her right ankle 2 years ago where she states she tore ligaments and notes she has had chronic pain since. Patient states over the last week she has had worsening pain to her right posterior and lateral ankle. She reports having sharp shooting pain to her ankle that radiates up to her mid calf, pain is worse with movement, walking, wearing her ankle brace or applying pressure to her ankle. She also reports having tingling in her right toes with walking. Endorses associated swelling to her right ankle. Denies fever, redness, numbness, weakness. She notes she has been elevating her ankle and using ice and ibuprofen at home without relief. Denies any recent fall, trauma or injury however she notes this week she has been caring heavy groceries up the stairs to her apartment and thinks she might twisted her ankle.   Past Medical History  Diagnosis Date  . Crohn's disease ([place])   . Psoriasis   . Gastroenteritis    Past Surgical History  Procedure Laterality Date  . Ileostomy    . Nephrostomy    . Kidney stent     Family History  Problem Relation Age of Onset  . Hyperlipidemia Father   . Hypertension Father   . Diabetes Neg Hx   . Heart attack Neg Hx   . Sudden death Neg Hx    Social History  Substance Use Topics  . Smoking status: Current Every Day Smoker -- 0.50 packs/day    Types: Cigarettes  . Smokeless tobacco: Never Used  . Alcohol Use: No     Comment: occ   OB History    No data available     Review of Systems  Constitutional: Negative  for fever.  Musculoskeletal: Positive for joint swelling and arthralgias.  Skin: Negative for rash.  Neurological: Negative for weakness and numbness.      Allergies  Chocolate; Aspirin; and Ibuprofen  Home Medications   Prior to Admission medications   Medication Sig Start Date End Date Taking? Authorizing Provider  acetaminophen (TYLENOL) 325 MG tablet Take 650 mg by mouth every 6 (six) hours as needed.   Yes Historical Provider, MD  ALPRAZolam Duanne Moron) 0.5 MG tablet Take 0.5 mg by mouth 2 (two) times daily as needed for anxiety.   Yes Historical Provider, MD  cholecalciferol (VITAMIN D) 400 UNITS TABS tablet Take 400 Units by mouth.   Yes Historical Provider, MD  esomeprazole (NEXIUM) 20 MG capsule Take 20 mg by mouth daily at 12 noon.   Yes Historical Provider, MD  Multiple Vitamin (MULTIVITAMIN) tablet Take 1 tablet by mouth daily.   Yes Historical Provider, MD  zolpidem (AMBIEN) 5 MG tablet Take 5 mg by mouth at bedtime as needed for sleep.   Yes Historical Provider, MD  dextromethorphan-guaiFENesin (MUCINEX DM) 30-600 MG per 12 hr tablet Take 1 tablet by mouth 2 (two) times daily. 03/17/14   Fredia Sorrow, MD  doxycycline (VIBRAMYCIN) 100 MG capsule Take 1 capsule (100 mg total) by mouth 2 (two) times daily. 04/22/15   Tanna Furry, MD  HYDROcodone-acetaminophen (NORCO/VICODIN) [phone]  MG tablet Take 2 tablets by mouth every 4 (four) hours as needed. 04/22/15   Tanna Furry, MD  traMADol (ULTRAM) 50 MG tablet Take 1 tablet (50 mg total) by mouth every 6 (six) hours as needed. 06/23/15   Chesley Noon Nade[place], PA-C   BP 122/76 mmHg  Pulse 115  Temp(Src) 98.5 F (36.9 C) (Oral)  Resp 20  Ht 5\' 7"  (1.702 m)  Wt 78.019 kg  BMI 26.93 kg/m2  SpO2 98% Physical Exam  Constitutional: She is oriented to person, place, and time. She appears well-developed and well-nourished.  HENT:  Head: Normocephalic and atraumatic.  Eyes: Conjunctivae and EOM are normal. Right eye exhibits no  discharge. Left eye exhibits no discharge. No scleral icterus.  Pulmonary/Chest: Effort normal.  Musculoskeletal: Normal range of motion. She exhibits no edema.       Right ankle: She exhibits normal range of motion, no swelling, no ecchymosis, no deformity, no laceration and normal pulse. Tenderness. Lateral malleolus tenderness found.  Mild TTP of right lateral and posterior ankle without any swelling present. Sensation intact. FROM of right ankle with 5/5 strength. 2+ DP pulses. Cap refill <2. No TTP at right calf or knee. Patient able to stand and ambulate without assistance.  Neurological: She is alert and oriented to person, place, and time.  Skin: Skin is warm and dry.  Nursing note and vitals reviewed.   ED Course  Procedures (including critical care time) Labs Review Labs Reviewed - No data to display  Imaging Review Dg Ankle Complete Right  06/23/2015  CLINICAL DATA:  Right lateral ankle pain shooting into right calf for 1 week. EXAM: RIGHT ANKLE - COMPLETE 3+ VIEW COMPARISON:  11/10/2012 FINDINGS: No acute bony abnormality. Specifically, no fracture, subluxation, or dislocation. Soft tissues are intact. 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 No change. IMPRESSION: No acute bony abnormality. Electronically Signed   By: Rolm Baptise M.D.   On: 06/23/2015 11:54   I have personally reviewed and evaluated these images and lab results as part of my medical decision-making.  Filed Vitals:   06/23/15 1031  BP: 122/76  Pulse: 115  Temp: 98.5 F (36.9 C)  Resp: 20     MDM   Final diagnoses:  Right ankle pain    Patient presents with right ankle pain and swelling. Denies any known trauma/fall but states she might have twisted her ankle while carrying heavy groceries upstairs to her apartment. History of ankle injury over a year ago with reported torn ligaments, no surgery. VSS. Exam revealed mild tenderness to right lateral and  posterior ankle without any swelling, right foot and leg neurovascularly intact. Right ankle x-ray revealed no acute bony abnormality. Plan to discharge patient home with pain meds and advised patient to continue using RICE tx. patient given resource guide to follow up with PCP.  Evaluation does not show pathology requring ongoing emergent intervention or admission. Pt is hemodynamically stable and mentating appropriately. Discussed findings/results and plan with patient/guardian, who agrees with plan. All questions answered. Return precautions discussed and outpatient follow up given.      Chesley Noon [place], [place] 06/23/15 1259  Blanchie Dessert, MD 06/23/15 [phone]

## 2015-06-23 NOTE — Discharge Instructions (Signed)
Take your medications as prescribed as needed for pain. I also recommend continuing to elevate her foot, use an Ace wrap for compression and apply ice for 15-20 minutes 3-4 times daily to help with pain and swelling. Please follow up with a primary care provider from the Resource Guide provided below in 1 week. Please return to the Emergency Department if symptoms worsen or new onset of fever, redness, swelling, numbness, tingling, weakness.   Emergency Department Resource Guide 1) Find a Doctor and Pay Out of Pocket Although you won't have to find out who is covered by your insurance plan, it is a good idea to ask around and get recommendations. You will then need to call the office and see if the doctor you have chosen will accept you as a new patient and what types of options they offer for patients who are self-pay. Some doctors offer discounts or will set up payment plans for their patients who do not have insurance, but you will need to ask so you aren't surprised when you get to your appointment.  2) Contact Your Local Health Department Not all health departments have doctors that can see patients for sick visits, but many do, so it is worth a call to see if yours does. If you don't know where your local health department is, you can check in your phone book. The CDC also has a tool to help you locate your state's health department, and many state websites also have listings of all of their local health departments.  3) Find a [facility] If your illness is not likely to be very severe or complicated, you may want to try a walk in clinic. These are popping up all over the country in pharmacies, drugstores, and shopping centers. They're usually staffed by nurse practitioners or physician assistants that have been trained to treat common illnesses and complaints. They're usually fairly quick and inexpensive. However, if you have serious medical issues or chronic medical problems, these are  probably not your best option.  No Primary Care Doctor: - Call Health Connect at  [phone] - they can help you locate a primary care doctor that  accepts your insurance, provides certain services, etc. - Physician Referral Service- [phone]  Chronic Pain Problems: Organization         Address  Phone   Notes  [facility]  [phone] Patients need to be referred by their primary care doctor.   Medication Assistance: Organization         Address  Phone   Notes  [facility] Medication [facility] [address] [place], [place] [address] [phone] --Must be a resident of [facility] -- Must have NO insurance coverage whatsoever (no Medicaid/ Medicare, etc.) -- The pt. MUST have a primary care doctor that directs their care regularly and follows them in the community   Med[facility]ist  [phone]   [employer]  [phone]    Agencies that provide inexpensive medical care: Organization         Address  Phone   Notes  [place]  [phone]   [facility]    [phone]   [facility] [place], [place] [address] [phone]   [place] [address], [place] [phone]   Planned Parenthood    [phone]   [facility]    [phone]  Community Health and [place]  [address], [place] Phone:  [phone], Fax:  [phone] Hours of Operation:  9 am - 6 pm, M-F.  Also accepts Medicaid/Medicare and self-pay.  [facility] for [place], [address], [place] Phone: [phone], Fax: [phone]. Hours of Operation:  8:30 am - 5:30 pm, M-F.  Also accepts Medicaid and self-pay.  [facility] [place] [address] [place] Phone: [phone]   [place], [place], [place] [phone], Ext. [phone] Mondays & Thursdays:  7-9 AM.  First 15 patients are seen on a first come, first serve basis.    [place] Providers:  Organization         Address  Phone   Notes  [facility] [address],  [phone] Also accepts self-pay patients.  [facility] [address], [place]  [phone]   [place], [address], [place] [phone]   [facility] [address] [place] [phone]   Lucianne Lei [address], [place]   [phone] Only accepts [place] Access [place] patients after they have their name applied to their card.   Self-Pay (no insurance) in [facility]:  Organization         Address  Phone   Notes  Sickle Cell Patients, [facility] [place] [phone]   [facility] [place] [phone]   [facility] [place]  [place], [place], [place] [phone]   [facility]/Dr. Osei-Bonsu  [address], [place] or [address], [place] [phone] Phone number for both [place] and [place] locations is the same.  Urgent Medical and [facility] [address], [place] [phone]   [facility] [address] [place] or [address] [phone]   [facility] [address], [place] [phone], phone; [phone], fax Sees patients 1st and 3rd Saturday of every month.  Must not qualify for public or private insurance (i.e. Medicaid, Medicare, [place] Health Choice, Veterans' Benefits)  Household income should be no more than 200% of the poverty level The clinic cannot treat you if you are pregnant or think you are pregnant  Sexually transmitted diseases are not treated at the clinic.     Dental Care: Organization         Address  Phone  Notes  [facility] [place] [phone] Accepts children up to age 46 who are enrolled in [place] or [place]; pregnant women with a Medicaid card; and children who have applied for Medicaid or [place] Health Choice, but were declined, whose parents can pay a reduced fee at time of service.  [facility] Department of [facility]  [address], [place] [phone] Accepts children up to age 58 who are enrolled in [place] or [place]; pregnant women with a Medicaid card; and children who have applied for Medicaid or [place] Health Choice, but were declined, whose parents can pay a reduced fee at time of service.  [place]  [phone]  [place] [phone] Patients are seen by appointment only. Walk-ins are not accepted. [place] will see patients 50 years of age and older. Monday - Tuesday (8am-5pm) Most Wednesdays (8:30-5pm) $30 per visit, cash only  [facility] Adult Dental Access PROGRAM  [address], [facility] [phone] Patients are seen by appointment only. Walk-ins are not accepted. [place] will see patients 104 years of age and older. One Wednesday Evening (Monthly: Volunteer Based).  $30 per visit, cash only  [place]  [phone] for adults; Children under age 13, call Graduate Pediatric Dentistry at [phone]. Children aged 62-14, please call [phone] to request a pediatric application.  Dental services are provided in all areas of dental care including fillings, crowns and bridges, complete and partial dentures, implants, gum treatment, root canals, and extractions. Preventive care is also provided. Treatment is provided to both adults and children. Patients are selected via a lottery and there is often a waiting  list.   [facility] [address] [place]  [phone] [email or web address]   [facility] [address] [place], [place] [phone], Ext. [phone] Second and Fourth Thursday of each month, opens at 6:30 AM; Clinic ends at 9 AM.  Patients are seen on a first-come first-served basis, and a limited number are seen during each clinic.   [facility]  [address] Hillard Danker [place], [place] [phone]   Eligibility Requirements You must have lived in [place], [place], or [place] counties for at least the last three months.   You cannot be eligible for state or federal sponsored [employer], including [employer], [place], or [employer].   You generally cannot be eligible for healthcare insurance through your employer.    How to apply: Eligibility screenings are held every Tuesday and Wednesday afternoon from 1:00 pm until 4:00 pm. You do not need an appointment for the interview!  [facility] [address], [place], [place]   [place]  [place] Department  [place]  [phone]    Behavioral Health Resources in the Community: Intensive Outpatient Programs Organization         Address  Phone  Notes  [place] R[place]. [address] [place], [place] [phone]   [facility] Outpatient [address] [place], [place]   ADS: Alcohol & Drug Svcs [address], [place], [place]   [place] [address],  [place], [place] or [phone]   Substance Abuse Resources Organization         Address  Phone  Notes  Alcohol and Drug Services  [phone]   [place]  [phone]   The [place]   Chinita Pester  [phone]   Residential & Outpatient Substance Abuse Program   [phone]   Psychological Services Organization         Address  Phone  Notes  [facility] [place]  [place]  [phone]   [place] [address] [place] or [phone]    Mobile Crisis Teams Organization         Address  Phone  Notes  [facility], Mobile Crisis Care Unit  [phone]   Assertive Psychotherapeutic Services  [address] [place], [place]  [facility] [place] [address] [place] [phone]    Self-Help/Support Groups Organization         Address  Phone             Notes  [facility]ental Health [facility]oc[facility] of [place] - variety of support groups  [place] Call for more information  Narcotics Anonymous (NA), [facility] [address], [employer] [place]  2 meetings at this location   Special educational needs teacher         Address  Phone  Notes  [facility] [place],    [place]  [phone]   [facility]  [address] [place] [phone], [place], [place]   [place], [place] [phone] Admissions: 8am-3pm M-F  Incentives Substance [place] [address]    [place], [place] [phone]   [facility] [address] [place], [place], [place]   [facility] [address],  [place], [place]   [facility] Programs - Intensive Outpatient [address], [place], [place]   [facility] ([place].,  [place], [place] [phone] or [phone]   [facility] (RTS) [address], [place], [place] Accepts Medicaid  [facility] [place] [address],  [place] [phone] Substance Abuse/Addiction Treatment   [facility] Organization          Address  Phone  Notes  [facility]  [phone]   Domenic Schwab, PhD [address] Arlis [place], [place]   [phone] or [phone]   [place] Britt [place], [place] [phone]   [facility] [phone], [place], [place] [phone] Insurance/Medicaid/sponsorship through [facility] and Families [address]                                    [place], [place] [phone] [place] [address], [place] [phone]    Dr. Adele Schilder  [phone]   [facility] Dept. 1) [address] [place] 2) [place] 3)  [address], [place] [phone]  [phone]   [place] [phone] or [phone] (After Hours)

## 2015-06-23 NOTE — ED Notes (Signed)
Pt reports injury to right ankle one year ago with torn ligaments. States she has had shooting pain in same ankle x 1 week. Denies new injury

## 2015-07-08 ENCOUNTER — Encounter: Payer: Self-pay | Admitting: Family Medicine

## 2015-07-08 ENCOUNTER — Ambulatory Visit (INDEPENDENT_AMBULATORY_CARE_PROVIDER_SITE_OTHER): Payer: Medicare Other | Admitting: Family Medicine

## 2015-07-08 VITALS — BP 129/82 | HR 156 | Ht 67.0 in | Wt 170.0 lb

## 2015-07-08 DIAGNOSIS — M25571 Pain in right ankle and joints of right foot: Secondary | ICD-10-CM | POA: Diagnosis not present

## 2015-07-08 MED ORDER — HYDROCODONE-ACETAMINOPHEN 5-325 MG PO TABS: 1.0000 | ORAL_TABLET | Freq: Four times a day (QID) | ORAL | Status: DC | PRN

## 2015-07-08 MED ORDER — PREDNISONE 10 MG PO TABS: ORAL_TABLET | ORAL | Status: DC

## 2015-07-08 MED FILL — predniSONE 10 MG TABS: 10 | 6 days supply | Qty: 21 | Fill #0

## 2015-07-08 MED FILL — HYDROCODON-APAP 5-325: 5-325 | 10 days supply | Qty: 40 | Fill #0

## 2015-07-08 NOTE — Patient Instructions (Signed)
You have an inflammatory arthritis (hopefully just a simple synovitis - inflammation of the lining of the ankle joint). Take prednisone as directed. Ice the area 15 minutes at a time 3-4 times a day. Norco as needed for severe pain. Wear boot if this feels more comfortable when up and walking around. Come out of this a couple times a day to do simple motion exercises. Follow up with me in 2 weeks for reevaluation.

## 2015-07-10 ENCOUNTER — Ambulatory Visit: Payer: Medicare Other | Admitting: Family Medicine

## 2015-07-10 DIAGNOSIS — F32A Depression, unspecified: Secondary | ICD-10-CM | POA: Insufficient documentation

## 2015-07-10 DIAGNOSIS — M25571 Pain in right ankle and joints of right foot: Secondary | ICD-10-CM | POA: Insufficient documentation

## 2015-07-10 DIAGNOSIS — F419 Anxiety disorder, unspecified: Secondary | ICD-10-CM | POA: Insufficient documentation

## 2015-07-10 DIAGNOSIS — F329 Major depressive disorder, single episode, unspecified: Secondary | ICD-10-CM | POA: Insufficient documentation

## 2015-07-10 NOTE — Progress Notes (Signed)
PCP: Dr. Holley Raring  Subjective:   HPI: Patient is a 35 y.o. female here for right ankle injury.  Patient reports having a bad sprain about 2 years ago in right ankle when she fell down 4 stairs and inverted ankle. Pain started to recur without injury this past December Lateral and anterior pain, throbbing. Radiates up to knee. Pain level 8/10. Feels worse wearing ASO. No skin changes, fever, other complaints.  Past Medical History  Diagnosis Date  . Crohn's disease ([place])   . Psoriasis   . Gastroenteritis     Current Outpatient Prescriptions on File Prior to Visit  Medication Sig Dispense Refill  . acetaminophen (TYLENOL) 325 MG tablet Take 650 mg by mouth every 6 (six) hours as needed.    . ALPRAZolam (XANAX) 0.5 MG tablet Take 0.5 mg by mouth 2 (two) times daily as needed for anxiety.    . cholecalciferol (VITAMIN D) 400 UNITS TABS tablet Take 400 Units by mouth.    . esomeprazole (NEXIUM) 20 MG capsule Take 20 mg by mouth daily at 12 noon.    . Multiple Vitamin (MULTIVITAMIN) tablet Take 1 tablet by mouth daily.    Marland Kitchen zolpidem (AMBIEN) 5 MG tablet Take 5 mg by mouth at bedtime as needed for sleep.    . [DISCONTINUED] lansoprazole (PREVACID) 30 MG capsule Take 1 capsule (30 mg total) by mouth daily. 30 capsule 0   No current facility-administered medications on file prior to visit.    Past Surgical History  Procedure Laterality Date  . Ileostomy    . Nephrostomy    . Kidney stent      Allergies  Allergen Reactions  . Chocolate Anaphylaxis and Hives  . Aspirin Other (See Comments)    Mouth and stomach irritation  . Ibuprofen Other (See Comments)    gastritis    Social History   Social History  . Marital Status: Divorced    Spouse Name: N/A  . Number of Children: N/A  . Years of Education: N/A   Occupational History  . Not on file.   Social History Main Topics  . Smoking status: Current Every Day Smoker -- 0.50 packs/day    Types: Cigarettes  . Smokeless  tobacco: Never Used  . Alcohol Use: No     Comment: occ  . Drug Use: No  . Sexual Activity: Yes    Birth Control/ Protection: Injection   Other Topics Concern  . Not on file   Social History Narrative    Family History  Problem Relation Age of Onset  . Hyperlipidemia Father   . Hypertension Father   . Diabetes Neg Hx   . Heart attack Neg Hx   . Sudden death Neg Hx     BP 129/82 mmHg  Pulse 156  Ht 5\' 7"  (1.702 m)  Wt 170 lb (77.111 kg)  BMI 26.62 kg/m2  Review of Systems: See HPI above.    Objective:  Physical Exam:  Gen: NAD  Right ankle/foot: Mild diffuse swelling.  No bruising, other deformity. Mod limitation ROM all directions. TTP over anterior ankle joint and ATFL. Negative ant drawer and talar tilt.   Negative syndesmotic compression. Thompsons test negative. NV intact distally.  Left ankle: FROM without pain.    Assessment & Plan:  1. Right ankle pain - consistent with inflammatory arthritis.  Prednisone dose pack.  Icing, norco as needed.  Cam walker with ambulation though come out of this to do motion exercises at least twice a day.  F/u in 2 weeks for reevaluation.

## 2015-07-10 NOTE — Assessment & Plan Note (Signed)
consistent with inflammatory arthritis.  Prednisone dose pack.  Icing, norco as needed.  Cam walker with ambulation though come out of this to do motion exercises at least twice a day.  F/u in 2 weeks for reevaluation.

## 2015-07-22 ENCOUNTER — Ambulatory Visit (INDEPENDENT_AMBULATORY_CARE_PROVIDER_SITE_OTHER): Payer: Medicare Other | Admitting: Family Medicine

## 2015-07-22 ENCOUNTER — Encounter: Payer: Self-pay | Admitting: Family Medicine

## 2015-07-22 VITALS — BP 109/76 | HR 89 | Ht 67.0 in | Wt 170.0 lb

## 2015-07-22 DIAGNOSIS — M25571 Pain in right ankle and joints of right foot: Secondary | ICD-10-CM | POA: Diagnosis not present

## 2015-07-22 MED ORDER — MELOXICAM 15 MG PO TABS: 15.0000 mg | ORAL_TABLET | Freq: Every day | ORAL | Status: DC

## 2015-07-22 MED ORDER — PREDNISONE 10 MG PO TABS: ORAL_TABLET | ORAL | Status: DC

## 2015-07-22 MED ORDER — TRAMADOL HCL 50 MG PO TABS
50.0000 mg | ORAL_TABLET | Freq: Four times a day (QID) | ORAL | Status: DC | PRN
Start: 2015-07-22 — End: 2016-03-03

## 2015-07-22 MED FILL — traMADol HCL 50 MG TABS: 50 | 10 days supply | Qty: 40 | Fill #0

## 2015-07-22 MED FILL — MELOXICAM 15 MG TABLET: 15 | 30 days supply | Qty: 30 | Fill #0

## 2015-07-22 MED FILL — predniSONE 10 MG TABS: 10 | 12 days supply | Qty: 42 | Fill #0

## 2015-07-22 NOTE — Patient Instructions (Addendum)
You have an inflammatory arthritis (hopefully just a simple synovitis - inflammation of the lining of the ankle joint). Take longer course of prednisone. Day AFTER finishing this start the meloxicam. Ice the area 15 minutes at a time 3-4 times a day. Tramadol as needed for severe pain. Wear boot if this feels more comfortable when up and walking around. Come out of this a couple times a day to do simple motion exercises. We will go ahead with an MRI of your ankle as well since you're not improving as expected.

## 2015-07-23 ENCOUNTER — Ambulatory Visit: Payer: Medicare Other | Admitting: Family Medicine

## 2015-07-23 NOTE — Assessment & Plan Note (Signed)
consistent with inflammatory arthritis though unfortunately pain recurred after improving.  Discussed options - will do a longer course of prednisone then switch to meloxicam.  Icing, tramadol as needed.  Cam walker for comfort when up and walking around.  Home motion exercises also.  Will go ahead with MRI to assess for other abnormalities as pain recurred.

## 2015-07-23 NOTE — Progress Notes (Signed)
PCP: Dr. Holley Raring  Subjective:   HPI: Patient is a 35 y.o. female here for right ankle injury.  1/13: Patient reports having a bad sprain about 2 years ago in right ankle when she fell down 4 stairs and inverted ankle. Pain started to recur without injury this past December Lateral and anterior pain, throbbing. Radiates up to knee. Pain level 8/10. Feels worse wearing ASO. No skin changes, fever, other complaints.  2/14: Patient reports she completely improved with prednisone then pain recurred. Pain back to 8/10, sharp anterior and lateral. Mild swelling. No skin changes, fever, other complaints.  Past Medical History  Diagnosis Date  . Crohn's disease ([place])   . Psoriasis   . Gastroenteritis     Current Outpatient Prescriptions on File Prior to Visit  Medication Sig Dispense Refill  . acetaminophen (TYLENOL) 325 MG tablet Take 650 mg by mouth every 6 (six) hours as needed.    . ALPRAZolam (XANAX) 0.5 MG tablet Take 0.5 mg by mouth 2 (two) times daily as needed for anxiety.    . cholecalciferol (VITAMIN D) 400 UNITS TABS tablet Take 400 Units by mouth.    . esomeprazole (NEXIUM) 20 MG capsule Take 20 mg by mouth daily at 12 noon.    . Multiple Vitamin (MULTIVITAMIN) tablet Take 1 tablet by mouth daily.    Marland Kitchen zolpidem (AMBIEN) 5 MG tablet Take 5 mg by mouth at bedtime as needed for sleep.    . [DISCONTINUED] lansoprazole (PREVACID) 30 MG capsule Take 1 capsule (30 mg total) by mouth daily. 30 capsule 0   No current facility-administered medications on file prior to visit.    Past Surgical History  Procedure Laterality Date  . Ileostomy    . Nephrostomy    . Kidney stent      Allergies  Allergen Reactions  . Chocolate Anaphylaxis and Hives  . Aspirin Other (See Comments)    Mouth and stomach irritation  . Ibuprofen Other (See Comments)    gastritis    Social History   Social History  . Marital Status: Divorced    Spouse Name: N/A  . Number of Children: N/A   . Years of Education: N/A   Occupational History  . Not on file.   Social History Main Topics  . Smoking status: Current Every Day Smoker -- 0.50 packs/day    Types: Cigarettes  . Smokeless tobacco: Never Used  . Alcohol Use: No     Comment: occ  . Drug Use: No  . Sexual Activity: Yes    Birth Control/ Protection: Injection   Other Topics Concern  . Not on file   Social History Narrative    Family History  Problem Relation Age of Onset  . Hyperlipidemia Father   . Hypertension Father   . Diabetes Neg Hx   . Heart attack Neg Hx   . Sudden death Neg Hx     BP 109/76 mmHg  Pulse 89  Ht 5\' 7"  (1.702 m)  Wt 170 lb (77.111 kg)  BMI 26.62 kg/m2  Review of Systems: See HPI above.    Objective:  Physical Exam:  Gen: NAD  Right ankle/foot: Minimal lateral swelling.  No bruising, other deformity. Mild limitation ROM all directions. TTP over anterior ankle joint and ATFL. Negative ant drawer and talar tilt.   Negative syndesmotic compression. Thompsons test negative. NV intact distally.  Left ankle: FROM without pain.    Assessment & Plan:  1. Right ankle pain - consistent with inflammatory arthritis  though unfortunately pain recurred after improving.  Discussed options - will do a longer course of prednisone then switch to meloxicam.  Icing, tramadol as needed.  Cam walker for comfort when up and walking around.  Home motion exercises also.  Will go ahead with MRI to assess for other abnormalities as pain recurred.

## 2015-07-24 NOTE — Addendum Note (Signed)
Addended by: Sherrie George F on: 07/24/2015 11:14 AM   Modules accepted: Orders

## 2015-08-02 ENCOUNTER — Ambulatory Visit (HOSPITAL_BASED_OUTPATIENT_CLINIC_OR_DEPARTMENT_OTHER): Payer: Medicare Other

## 2015-08-19 ENCOUNTER — Emergency Department (HOSPITAL_BASED_OUTPATIENT_CLINIC_OR_DEPARTMENT_OTHER)
Admission: EM | Admit: 2015-08-19 | Discharge: 2015-08-19 | Disposition: A | Discharge status: Home / Self Care | Payer: Medicare Other | Attending: Emergency Medicine | Admitting: Emergency Medicine

## 2015-08-19 ENCOUNTER — Encounter (HOSPITAL_BASED_OUTPATIENT_CLINIC_OR_DEPARTMENT_OTHER): Payer: Self-pay | Admitting: *Deleted

## 2015-08-19 DIAGNOSIS — Z872 Personal history of diseases of the skin and subcutaneous tissue: Secondary | ICD-10-CM | POA: Diagnosis not present

## 2015-08-19 DIAGNOSIS — N764 Abscess of vulva: Secondary | ICD-10-CM | POA: Insufficient documentation

## 2015-08-19 DIAGNOSIS — Z791 Long term (current) use of non-steroidal anti-inflammatories (NSAID): Secondary | ICD-10-CM | POA: Insufficient documentation

## 2015-08-19 DIAGNOSIS — Z8719 Personal history of other diseases of the digestive system: Secondary | ICD-10-CM | POA: Insufficient documentation

## 2015-08-19 DIAGNOSIS — Z79899 Other long term (current) drug therapy: Secondary | ICD-10-CM | POA: Diagnosis not present

## 2015-08-19 DIAGNOSIS — F17210 Nicotine dependence, cigarettes, uncomplicated: Secondary | ICD-10-CM | POA: Insufficient documentation

## 2015-08-19 MED ORDER — CEPHALEXIN 500 MG PO CAPS: 500.0000 mg | ORAL_CAPSULE | Freq: Four times a day (QID) | ORAL | Status: DC

## 2015-08-19 MED ORDER — HYDROCODONE-ACETAMINOPHEN 5-325 MG PO TABS: 1.0000 | ORAL_TABLET | Freq: Four times a day (QID) | ORAL | Status: DC | PRN

## 2015-08-19 MED ORDER — SULFAMETHOXAZOLE-TRIMETHOPRIM 800-160 MG PO TABS: 1.0000 | ORAL_TABLET | Freq: Two times a day (BID) | ORAL | Status: AC

## 2015-08-19 MED ORDER — LIDOCAINE HCL (PF) 1 % IJ SOLN
INTRAMUSCULAR | Status: AC
  Filled 2015-08-19: qty 5

## 2015-08-19 MED FILL — HYDROCODON-APAP 5-325: 5-325 | 2 days supply | Qty: 15 | Fill #0

## 2015-08-19 MED FILL — CEPHALEXIN 500 MG CAPSULE: 500 | 7 days supply | Qty: 28 | Fill #0

## 2015-08-19 MED FILL — SULFAMETHOXAZOLE-TMP DS TAB: 800-160 | 7 days supply | Qty: 14 | Fill #0

## 2015-08-19 NOTE — ED Provider Notes (Signed)
CSN: [id number]     Arrival date & time 08/19/15  1312 History   First MD Initiated Contact with Patient 08/19/15 1350     Chief Complaint  Patient presents with  . Abscess     (Consider location/radiation/quality/duration/timing/severity/associated sxs/prior Treatment) HPI Comments: Patient is a 35 year old female with history of hidradenitis for fever. She presents with complaints of pain and swelling to the left groin area. She has had multiple abscesses on her left labia in the past and fears another one is coming back. Area is draining. She denies any fevers or chills.  Patient is a 35 y.o. female presenting with abscess. The history is provided by the patient.  Abscess Abscess location: Groin. Abscess quality: draining and induration   Abscess quality: no fluctuance   Red streaking: no   Progression:  Worsening Chronicity:  Recurrent Relieved by:  Nothing Worsened by:  Nothing tried Ineffective treatments:  None tried   Past Medical History  Diagnosis Date  . Crohn's disease ([place])   . Psoriasis   . Gastroenteritis    Past Surgical History  Procedure Laterality Date  . Ileostomy    . Nephrostomy    . Kidney stent     Family History  Problem Relation Age of Onset  . Hyperlipidemia Father   . Hypertension Father   . Diabetes Neg Hx   . Heart attack Neg Hx   . Sudden death Neg Hx    Social History  Substance Use Topics  . Smoking status: Current Every Day Smoker -- 0.50 packs/day    Types: Cigarettes  . Smokeless tobacco: Never Used  . Alcohol Use: No     Comment: occ   OB History    No data available     Review of Systems  All other systems reviewed and are negative.     Allergies  Chocolate; Aspirin; and Ibuprofen  Home Medications   Prior to Admission medications   Medication Sig Start Date End Date Taking? Authorizing Provider  acetaminophen (TYLENOL) 325 MG tablet Take 650 mg by mouth every 6 (six) hours as needed.    Historical Provider,  MD  ALPRAZolam Duanne Moron) 0.5 MG tablet Take 0.5 mg by mouth 2 (two) times daily as needed for anxiety.    Historical Provider, MD  cholecalciferol (VITAMIN D) 400 UNITS TABS tablet Take 400 Units by mouth.    Historical Provider, MD  esomeprazole (NEXIUM) 20 MG capsule Take 20 mg by mouth daily at 12 noon.    Historical Provider, MD  meloxicam (MOBIC) 15 MG tablet Take 1 tablet (15 mg total) by mouth daily. Start day AFTER finishing ibuprofen - make sure you take with food 07/22/15   Dene Gentry, MD  Multiple Vitamin (MULTIVITAMIN) tablet Take 1 tablet by mouth daily.    Historical Provider, MD  predniSONE (DELTASONE) 10 MG tablet 6 tabs po days 1-2, 5 tabs po days 3-4, 4 tabs po days 5-6, 3 tabs po days 7-8, 2 tabs po days 9-10, 1 tab po days 11-12 07/22/15   Dene Gentry, MD  traMADol (ULTRAM) 50 MG tablet Take 1 tablet (50 mg total) by mouth every 6 (six) hours as needed. 07/22/15   Dene Gentry, MD  zolpidem (AMBIEN) 5 MG tablet Take 5 mg by mouth at bedtime as needed for sleep.    Historical Provider, MD   BP 100/74 mmHg  Pulse 66  Temp(Src) 97.3 F (36.3 C) (Oral)  Resp 20  Ht 5\' 7"  (1.702 m)  Wt 170 lb (77.111 kg)  BMI 26.62 kg/m2  SpO2 98% Physical Exam  Constitutional: She is oriented to person, place, and time. She appears well-developed and well-nourished. No distress.  HENT:  Head: Normocephalic and atraumatic.  Neck: Normal range of motion. Neck supple.  Neurological: She is alert and oriented to person, place, and time.  Skin: Skin is warm and dry. She is not diaphoretic.  There are areas in the left labia of induration and drainage of serous sanguinous material. There is no definite fluctuance or abscess noted that could be a target for incision and drainage.  Nursing note and vitals reviewed.   ED Course  Procedures (including critical care time) Labs Review Labs Reviewed - No data to display  Imaging Review No results found. I have personally reviewed and  evaluated these images and lab results as part of my medical decision-making.   EKG Interpretation None      MDM   Final diagnoses:  None    I see no definite area to incise and drain. There is some induration and drainage. This will be treated with Keflex and Bactrim, warm soaks, pain medication, and when necessary return.    Veryl Speak, MD 08/19/15 [phone]

## 2015-08-19 NOTE — ED Notes (Signed)
Abscess to her left labia x 2 weeks.

## 2015-08-19 NOTE — Discharge Instructions (Signed)
Keflex and Bactrim as prescribed.  Continue warm soaks to the area as frequently as possible for the next several days.  Return to the emergency department if your symptoms significantly worsen or change.   Abscess An abscess is an infected area that contains a collection of pus and debris.It can occur in almost any part of the body. An abscess is also known as a furuncle or boil. CAUSES  An abscess occurs when tissue gets infected. This can occur from blockage of oil or sweat glands, infection of hair follicles, or a minor injury to the skin. As the body tries to fight the infection, pus collects in the area and creates pressure under the skin. This pressure causes pain. People with weakened immune systems have difficulty fighting infections and get certain abscesses more often.  SYMPTOMS Usually an abscess develops on the skin and becomes a painful mass that is red, warm, and tender. If the abscess forms under the skin, you may feel a moveable soft area under the skin. Some abscesses break open (rupture) on their own, but most will continue to get worse without care. The infection can spread deeper into the body and eventually into the bloodstream, causing you to feel ill.  DIAGNOSIS  Your caregiver will take your medical history and perform a physical exam. A sample of fluid may also be taken from the abscess to determine what is causing your infection. TREATMENT  Your caregiver may prescribe antibiotic medicines to fight the infection. However, taking antibiotics alone usually does not cure an abscess. Your caregiver may need to make a small cut (incision) in the abscess to drain the pus. In some cases, gauze is packed into the abscess to reduce pain and to continue draining the area. HOME CARE INSTRUCTIONS   Only take over-the-counter or prescription medicines for pain, discomfort, or fever as directed by your caregiver.  If you were prescribed antibiotics, take them as directed. Finish  them even if you start to feel better.  If gauze is used, follow your caregiver's directions for changing the gauze.  To avoid spreading the infection:  Keep your draining abscess covered with a bandage.  Wash your hands well.  Do not share personal care items, towels, or whirlpools with others.  Avoid skin contact with others.  Keep your skin and clothes clean around the abscess.  Keep all follow-up appointments as directed by your caregiver. SEEK MEDICAL CARE IF:   You have increased pain, swelling, redness, fluid drainage, or bleeding.  You have muscle aches, chills, or a general ill feeling.  You have a fever. MAKE SURE YOU:   Understand these instructions.  Will watch your condition.  Will get help right away if you are not doing well or get worse.   This information is not intended to replace advice given to you by your health care provider. Make sure you discuss any questions you have with your health care provider.   Document Released: 03/03/2005 Document Revised: 11/23/2011 Document Reviewed: 08/06/2011 Elsevier Interactive Patient Education [employer].

## 2015-09-05 ENCOUNTER — Encounter (HOSPITAL_BASED_OUTPATIENT_CLINIC_OR_DEPARTMENT_OTHER): Payer: Self-pay | Admitting: Emergency Medicine

## 2015-09-05 ENCOUNTER — Emergency Department (HOSPITAL_BASED_OUTPATIENT_CLINIC_OR_DEPARTMENT_OTHER)
Admission: EM | Admit: 2015-09-05 | Discharge: 2015-09-05 | Disposition: A | Discharge status: Home / Self Care | Payer: Medicare Other | Attending: Emergency Medicine | Admitting: Emergency Medicine

## 2015-09-05 DIAGNOSIS — L0291 Cutaneous abscess, unspecified: Secondary | ICD-10-CM

## 2015-09-05 DIAGNOSIS — Z79899 Other long term (current) drug therapy: Secondary | ICD-10-CM | POA: Diagnosis not present

## 2015-09-05 DIAGNOSIS — Z792 Long term (current) use of antibiotics: Secondary | ICD-10-CM | POA: Insufficient documentation

## 2015-09-05 DIAGNOSIS — Z791 Long term (current) use of non-steroidal anti-inflammatories (NSAID): Secondary | ICD-10-CM | POA: Insufficient documentation

## 2015-09-05 DIAGNOSIS — Z8719 Personal history of other diseases of the digestive system: Secondary | ICD-10-CM | POA: Insufficient documentation

## 2015-09-05 DIAGNOSIS — F17210 Nicotine dependence, cigarettes, uncomplicated: Secondary | ICD-10-CM | POA: Insufficient documentation

## 2015-09-05 DIAGNOSIS — N764 Abscess of vulva: Secondary | ICD-10-CM | POA: Diagnosis not present

## 2015-09-05 DIAGNOSIS — L732 Hidradenitis suppurativa: Secondary | ICD-10-CM

## 2015-09-05 MED ORDER — DOXYCYCLINE HYCLATE 100 MG PO CAPS: 100.0000 mg | ORAL_CAPSULE | Freq: Two times a day (BID) | ORAL | Status: DC

## 2015-09-05 MED ORDER — HYDROCODONE-ACETAMINOPHEN 5-325 MG PO TABS
1.0000 | ORAL_TABLET | Freq: Once | ORAL | Status: AC
  Administered 2015-09-05: 1 via ORAL
  Filled 2015-09-05: qty 1

## 2015-09-05 NOTE — ED Provider Notes (Signed)
CSN: [id number]     Arrival date & time 09/05/15  1711 History   First MD Initiated Contact with Patient 09/05/15 1731     Chief Complaint  Patient presents with  . Abscess   Patient is a 35 y.o. female presenting with abscess.  Abscess Location:  Ano-genital Ano-genital abscess location:  Groin Abscess quality: draining, induration and painful   Abscess quality: no redness and no warmth   Red streaking: no   Duration:  1 month Progression:  Improving Chronicity:  Recurrent Relieved by:  Nothing Ineffective treatments:  Oral antibiotics, warm compresses and warm water soaks Associated symptoms: no fever, no nausea and no vomiting   Risk factors: prior abscess    Ms. Dorff is a 35 year old female with PMHx of Crohn's disease and hidradenitis presenting with abscess. She was seen in this ED two weeks ago for same complaint and discharged with keflex and bactrim. She reports compliance with her medications. She states the labial abscess has decreased in size but is still draining and painful. She denies redness of the skin around her labia. She has also been using hot compresses without relief. She denies new abscesses. Denies fevers, chills, dizziness, syncope, abdominal pain, nausea or vomiting. She reports a history of labial abscesses and states she has suffered from abscesses since she was a teenager. She does not have a PCP.   Past Medical History  Diagnosis Date  . Crohn's disease ([place])   . Psoriasis   . Gastroenteritis    Past Surgical History  Procedure Laterality Date  . Ileostomy    . Nephrostomy    . Kidney stent     Family History  Problem Relation Age of Onset  . Hyperlipidemia Father   . Hypertension Father   . Diabetes Neg Hx   . Heart attack Neg Hx   . Sudden death Neg Hx    Social History  Substance Use Topics  . Smoking status: Current Every Day Smoker -- 0.50 packs/day    Types: Cigarettes  . Smokeless tobacco: Never Used  . Alcohol Use: No   Comment: occ   OB History    No data available     Review of Systems  Constitutional: Negative for fever.  Gastrointestinal: Negative for nausea and vomiting.  Skin: Positive for wound.  All other systems reviewed and are negative.     Allergies  Chocolate; Aspirin; and Ibuprofen  Home Medications   Prior to Admission medications   Medication Sig Start Date End Date Taking? Authorizing Provider  acetaminophen (TYLENOL) 325 MG tablet Take 650 mg by mouth every 6 (six) hours as needed.    Historical Provider, MD  ALPRAZolam Duanne Moron) 0.5 MG tablet Take 0.5 mg by mouth 2 (two) times daily as needed for anxiety.    Historical Provider, MD  cephALEXin (KEFLEX) 500 MG capsule Take 1 capsule (500 mg total) by mouth 4 (four) times daily. 08/19/15   Veryl Speak, MD  cholecalciferol (VITAMIN D) 400 UNITS TABS tablet Take 400 Units by mouth.    Historical Provider, MD  doxycycline (VIBRAMYCIN) 100 MG capsule Take 1 capsule (100 mg total) by mouth 2 (two) times daily. 09/05/15   Arnaldo Heffron, PA-C  esomeprazole (NEXIUM) 20 MG capsule Take 20 mg by mouth daily at 12 noon.    Historical Provider, MD  HYDROcodone-acetaminophen (NORCO) 5-325 MG tablet Take 1-2 tablets by mouth every 6 (six) hours as needed. 08/19/15   Veryl Speak, MD  meloxicam (MOBIC) 15 MG tablet  Take 1 tablet (15 mg total) by mouth daily. Start day AFTER finishing ibuprofen - make sure you take with food 07/22/15   Dene Gentry, MD  Multiple Vitamin (MULTIVITAMIN) tablet Take 1 tablet by mouth daily.    Historical Provider, MD  predniSONE (DELTASONE) 10 MG tablet 6 tabs po days 1-2, 5 tabs po days 3-4, 4 tabs po days 5-6, 3 tabs po days 7-8, 2 tabs po days 9-10, 1 tab po days 11-12 07/22/15   Dene Gentry, MD  traMADol (ULTRAM) 50 MG tablet Take 1 tablet (50 mg total) by mouth every 6 (six) hours as needed. 07/22/15   Dene Gentry, MD  zolpidem (AMBIEN) 5 MG tablet Take 5 mg by mouth at bedtime as needed for sleep.     Historical Provider, MD   BP 106/74 mmHg  Pulse 84  Temp(Src) 98.6 F (37 C) (Oral)  Resp 16  Ht 5\' 7"  (1.702 m)  Wt 79.833 kg  BMI 27.56 kg/m2  SpO2 100% Physical Exam  Constitutional: She appears well-developed and well-nourished. No distress.  HENT:  Head: Normocephalic and atraumatic.  Right Ear: External ear normal.  Left Ear: External ear normal.  Eyes: Conjunctivae are normal. Right eye exhibits no discharge. Left eye exhibits no discharge. No scleral icterus.  Neck: Normal range of motion.  Cardiovascular: Normal rate.   Pulmonary/Chest: Effort normal.  Genitourinary:  Left labia with 2.5 cm area of induration with serosanguinous drainage. No fluctuance, erythema, streaking, vesicles, pustules or desquamation.   Musculoskeletal: Normal range of motion.  Moves all extremities spontaneously  Neurological: She is alert. Coordination normal.  Skin: Skin is warm and dry.  Psychiatric: She has a normal mood and affect. Her behavior is normal.  Nursing note and vitals reviewed.   ED Course  Procedures (including critical care time) Labs Review Labs Reviewed - No data to display  Imaging Review No results found. I have personally reviewed and evaluated these images and lab results as part of my medical decision-making.   EKG Interpretation None      MDM   Final diagnoses:  Hidradenitis  Abscess   35 year old female presenting with abscess of the labia. History of abscesses due to hidradenitis. Previously treated with Bactrim and Keflex. She reports improvement in her abscess size but it is still draining and painful. No new symptoms. Afebrile and nontoxic appearing. Area of induration draining serosanguineous fluid is noted to the left labia. No fluctuance or overlying erythema. No abscess amenable to incision and drainage. No signs of overlying cellulitis. Patient does not have a PCP. We'll prescribe course of doxycycline and encouraged her to follow up with her  PCP for recurrent abscesses. I have also discussed good hygiene and using warm compress soaks. Return precautions given in discharge paperwork and discussed with pt at bedside. Pt stable for discharge    Josephina Gip, PA-C 09/05/15 [place], MD 09/05/15 [phone]

## 2015-09-05 NOTE — ED Notes (Signed)
Pt states she was seen here 2 weeks ago for same complaint, given antibiotics, took all of them, but it is still draining.

## 2015-09-05 NOTE — Discharge Instructions (Signed)
Schedule a follow up with a PCP to discuss your frequent abscesses.   Abscess An abscess is an infected area that contains a collection of pus and debris.It can occur in almost any part of the body. An abscess is also known as a furuncle or boil. CAUSES  An abscess occurs when tissue gets infected. This can occur from blockage of oil or sweat glands, infection of hair follicles, or a minor injury to the skin. As the body tries to fight the infection, pus collects in the area and creates pressure under the skin. This pressure causes pain. People with weakened immune systems have difficulty fighting infections and get certain abscesses more often.  SYMPTOMS Usually an abscess develops on the skin and becomes a painful mass that is red, warm, and tender. If the abscess forms under the skin, you may feel a moveable soft area under the skin. Some abscesses break open (rupture) on their own, but most will continue to get worse without care. The infection can spread deeper into the body and eventually into the bloodstream, causing you to feel ill.  DIAGNOSIS  Your caregiver will take your medical history and perform a physical exam. A sample of fluid may also be taken from the abscess to determine what is causing your infection. TREATMENT  Your caregiver may prescribe antibiotic medicines to fight the infection. However, taking antibiotics alone usually does not cure an abscess. Your caregiver may need to make a small cut (incision) in the abscess to drain the pus. In some cases, gauze is packed into the abscess to reduce pain and to continue draining the area. HOME CARE INSTRUCTIONS   Only take over-the-counter or prescription medicines for pain, discomfort, or fever as directed by your caregiver.  If you were prescribed antibiotics, take them as directed. Finish them even if you start to feel better.  If gauze is used, follow your caregiver's directions for changing the gauze.  To avoid spreading  the infection:  Keep your draining abscess covered with a bandage.  Wash your hands well.  Do not share personal care items, towels, or whirlpools with others.  Avoid skin contact with others.  Keep your skin and clothes clean around the abscess.  Keep all follow-up appointments as directed by your caregiver. SEEK MEDICAL CARE IF:   You have increased pain, swelling, redness, fluid drainage, or bleeding.  You have muscle aches, chills, or a general ill feeling.  You have a fever. MAKE SURE YOU:   Understand these instructions.  Will watch your condition.  Will get help right away if you are not doing well or get worse.   This information is not intended to replace advice given to you by your health care provider. Make sure you discuss any questions you have with your health care provider.   Document Released: 03/03/2005 Document Revised: 11/23/2011 Document Reviewed: 08/06/2011 Elsevier Interactive Patient Education 2016 [employer]  Hidradenitis Suppurativa Hidradenitis suppurativa is a long-term (chronic) skin disease that starts with blocked sweat glands or hair follicles. Bacteria may grow in these blocked openings of your skin. Hidradenitis suppurativa is like a severe form of acne that develops in areas of your body where acne would be unusual. It is most likely to affect the areas of your body where skin rubs against skin and becomes moist. This includes your:  Underarms.  Groin.  Genital areas.  Buttocks.  Upper thighs.  Breasts. Hidradenitis suppurativa may start out with small pimples. The pimples can develop into deep  sores that break open (rupture) and drain pus. Over time your skin may thicken and become scarred. Hidradenitis suppurativa cannot be passed from person to person.  CAUSES  The exact cause of hidradenitis suppurativa is not known. This condition may be due to:  Female and female hormones. The condition is rare before and after  puberty.  An overactive body defense system (immune system). Your immune system may overreact to the blocked hair follicles or sweat glands and cause swelling and pus-filled sores. RISK FACTORS You may have a higher risk of hidradenitis suppurativa if you:  Are a woman.  Are between ages 55 and 84.  Have a family history of hidradenitis suppurativa.  Have a personal history of acne.  Are overweight.  Smoke.  Take the drug lithium. SIGNS AND SYMPTOMS  The first signs of an outbreak are usually painful skin bumps that look like pimples. As the condition progresses:  Skin bumps may get bigger and grow deeper into the skin.  Bumps under the skin may rupture and drain smelly pus.  Skin may become itchy and infected.  Skin may thicken and scar.  Drainage may continue through tunnels under the skin (fistulas).  Walking and moving your arms can become painful. DIAGNOSIS  Your health care provider may diagnose hidradenitis suppurativa based on your medical history and your signs and symptoms. A physical exam will also be done. You may need to see a health care provider who specializes in skin diseases (dermatologist). You may also have tests done to confirm the diagnosis. These can include:  Swabbing a sample of pus or drainage from your skin so it can be sent to the lab and tested for infection.  Blood tests to check for infection. TREATMENT  The same treatment will not work for everybody with hidradenitis suppurativa. Your treatment will depend on how severe your symptoms are. You may need to try several treatments to find what works best for you. Part of your treatment may include cleaning and bandaging (dressing) your wounds. You may also have to take medicines, such as the following:  Antibiotics.  Acne medicines.  Medicines to block or suppress the immune system.  A diabetes medicine (metformin) is sometimes used to treat this condition.  For women, birth control pills  can sometimes help relieve symptoms. You may need surgery if you have a severe case of hidradenitis suppurativa that does not respond to medicine. Surgery may involve:   Using a laser to clear the skin and remove hair follicles.  Opening and draining deep sores.  Removing the areas of skin that are diseased and scarred. HOME CARE INSTRUCTIONS  Learn as much as you can about your disease, and work closely with your health care providers.  Take medicines only as directed by your health care provider.  If you were prescribed an antibiotic medicine, finish it all even if you start to feel better.  If you are overweight, losing weight may be very helpful. Try to reach and maintain a healthy weight.  Do not use any tobacco products, including cigarettes, chewing tobacco, or electronic cigarettes. If you need help quitting, ask your health care provider.  Do not shave the areas where you get hidradenitis suppurativa.  Do not wear deodorant.  Wear loose-fitting clothes.  Try not to overheat and get sweaty.  Take a daily bleach bath as directed by your health care provider.  Fill your bathtub halfway with water.  Pour in  cup of unscented household bleach.  Soak for  5-10 minutes.  Cover sore areas with a warm, clean washcloth (compress) for 5-10 minutes. SEEK MEDICAL CARE IF:   You have a flare-up of hidradenitis suppurativa.  You have chills or a fever.  You are having trouble controlling your symptoms at home.   This information is not intended to replace advice given to you by your health care provider. Make sure you discuss any questions you have with your health care provider.   Document Released: 01/06/2004 Document Revised: 06/14/2014 Document Reviewed: 08/24/2013 Elsevier Interactive Patient Education [employer].

## 2015-11-13 ENCOUNTER — Encounter (HOSPITAL_BASED_OUTPATIENT_CLINIC_OR_DEPARTMENT_OTHER): Payer: Self-pay | Admitting: Emergency Medicine

## 2015-11-13 ENCOUNTER — Emergency Department (HOSPITAL_BASED_OUTPATIENT_CLINIC_OR_DEPARTMENT_OTHER)
Admission: EM | Admit: 2015-11-13 | Discharge: 2015-11-13 | Disposition: A | Discharge status: Home / Self Care | Payer: Medicare Other | Attending: Emergency Medicine | Admitting: Emergency Medicine

## 2015-11-13 DIAGNOSIS — Z79899 Other long term (current) drug therapy: Secondary | ICD-10-CM | POA: Diagnosis not present

## 2015-11-13 DIAGNOSIS — F17210 Nicotine dependence, cigarettes, uncomplicated: Secondary | ICD-10-CM | POA: Diagnosis not present

## 2015-11-13 DIAGNOSIS — L0291 Cutaneous abscess, unspecified: Secondary | ICD-10-CM

## 2015-11-13 DIAGNOSIS — L02411 Cutaneous abscess of right axilla: Secondary | ICD-10-CM | POA: Diagnosis present

## 2015-11-13 MED ORDER — HYDROCODONE-ACETAMINOPHEN 5-325 MG PO TABS
1.0000 | ORAL_TABLET | Freq: Once | ORAL | Status: AC
  Administered 2015-11-13: 1 via ORAL
  Filled 2015-11-13: qty 1

## 2015-11-13 MED ORDER — SULFAMETHOXAZOLE-TRIMETHOPRIM 800-160 MG PO TABS
2.0000 | ORAL_TABLET | Freq: Once | ORAL | Status: AC
  Administered 2015-11-13: 2 via ORAL
  Filled 2015-11-13: qty 2

## 2015-11-13 MED ORDER — SULFAMETHOXAZOLE-TRIMETHOPRIM 800-160 MG PO TABS: 2.0000 | ORAL_TABLET | Freq: Two times a day (BID) | ORAL | Status: DC

## 2015-11-13 MED ORDER — HYDROCODONE-ACETAMINOPHEN 5-325 MG PO TABS: 2.0000 | ORAL_TABLET | ORAL | Status: DC | PRN

## 2015-11-13 MED ORDER — LIDOCAINE-EPINEPHRINE 1 %-1:100000 IJ SOLN
20.0000 mL | Freq: Once | INTRAMUSCULAR | Status: AC
  Administered 2015-11-13: 20 mL
  Filled 2015-11-13: qty 1

## 2015-11-13 NOTE — Discharge Instructions (Signed)
Soak and removed the gauze in 48 hours. After gauze removal, so can gently massage the area twice per day to encourage drainage   Abscess An abscess is an infected area that contains a collection of pus and debris.It can occur in almost any part of the body. An abscess is also known as a furuncle or boil. CAUSES  An abscess occurs when tissue gets infected. This can occur from blockage of oil or sweat glands, infection of hair follicles, or a minor injury to the skin. As the body tries to fight the infection, pus collects in the area and creates pressure under the skin. This pressure causes pain. People with weakened immune systems have difficulty fighting infections and get certain abscesses more often.  SYMPTOMS Usually an abscess develops on the skin and becomes a painful mass that is red, warm, and tender. If the abscess forms under the skin, you may feel a moveable soft area under the skin. Some abscesses break open (rupture) on their own, but most will continue to get worse without care. The infection can spread deeper into the body and eventually into the bloodstream, causing you to feel ill.  DIAGNOSIS  Your caregiver will take your medical history and perform a physical exam. A sample of fluid may also be taken from the abscess to determine what is causing your infection. TREATMENT  Your caregiver may prescribe antibiotic medicines to fight the infection. However, taking antibiotics alone usually does not cure an abscess. Your caregiver may need to make a small cut (incision) in the abscess to drain the pus. In some cases, gauze is packed into the abscess to reduce pain and to continue draining the area. HOME CARE INSTRUCTIONS   Only take over-the-counter or prescription medicines for pain, discomfort, or fever as directed by your caregiver.  If you were prescribed antibiotics, take them as directed. Finish them even if you start to feel better.  If gauze is used, follow your  caregiver's directions for changing the gauze.  To avoid spreading the infection:  Keep your draining abscess covered with a bandage.  Wash your hands well.  Do not share personal care items, towels, or whirlpools with others.  Avoid skin contact with others.  Keep your skin and clothes clean around the abscess.  Keep all follow-up appointments as directed by your caregiver. SEEK MEDICAL CARE IF:   You have increased pain, swelling, redness, fluid drainage, or bleeding.  You have muscle aches, chills, or a general ill feeling.  You have a fever. MAKE SURE YOU:   Understand these instructions.  Will watch your condition.  Will get help right away if you are not doing well or get worse.   This information is not intended to replace advice given to you by your health care provider. Make sure you discuss any questions you have with your health care provider.   Document Released: 03/03/2005 Document Revised: 11/23/2011 Document Reviewed: 08/06/2011 Elsevier Interactive Patient Education [employer].

## 2015-11-13 NOTE — ED Provider Notes (Signed)
CSN: [id number]     Arrival date & time 11/13/15  1719 History  By signing my name below, I, Gwenlyn Fudge, attest that this documentation has been prepared under the direction and in the presence of Tanna Furry, MD. Electronically Signed: Gwenlyn Fudge, ED Scribe. 11/13/2015. 7:09 PM.  Chief Complaint  Patient presents with  . Abscess    The history is provided by the patient. No language interpreter was used.    HPI Comments: Michelle Hill is a 35 y.o. female who presents to the Emergency Department complaining of an area of pain, redness and swelling in her right underarm onset 3 weeks ago. Pt reports that she was seen by her PCP a few weeks prior for multiple similar areas of swelling and was treated with antibiotics with resolution of all areas aside from her right axilla. She states that she has completed taking her antibiotics for approximately 10 days. Pt states her pain is worsened with palpation. Pt denies fever, shakes, or chills.   Past Medical History  Diagnosis Date  . Crohn's disease ([place])   . Psoriasis   . Gastroenteritis    Past Surgical History  Procedure Laterality Date  . Ileostomy    . Nephrostomy    . Kidney stent     Family History  Problem Relation Age of Onset  . Hyperlipidemia Father   . Hypertension Father   . Diabetes Neg Hx   . Heart attack Neg Hx   . Sudden death Neg Hx    Social History  Substance Use Topics  . Smoking status: Current Every Day Smoker -- 0.50 packs/day    Types: Cigarettes  . Smokeless tobacco: Never Used  . Alcohol Use: No     Comment: occ   OB History    No data available     Review of Systems  Constitutional: Negative for fever, chills, diaphoresis, appetite change and fatigue.  HENT: Negative for mouth sores, sore throat and trouble swallowing.   Eyes: Negative for visual disturbance.  Respiratory: Negative for cough, chest tightness, shortness of breath and wheezing.   Cardiovascular: Negative for chest pain.   Gastrointestinal: Negative for nausea, vomiting, abdominal pain, diarrhea and abdominal distention.  Endocrine: Negative for polydipsia, polyphagia and polyuria.  Genitourinary: Negative for dysuria, frequency and hematuria.  Musculoskeletal: Negative for gait problem.  Skin: Positive for color change. Negative for pallor and rash.       +area of pain, swelling and redness to right axilla   Neurological: Negative for dizziness, syncope, light-headedness and headaches.  Hematological: Does not bruise/bleed easily.  Psychiatric/Behavioral: Negative for behavioral problems and confusion.      Allergies  Chocolate; Aspirin; and Ibuprofen  Home Medications   Prior to Admission medications   Medication Sig Start Date End Date Taking? Authorizing Provider  acetaminophen (TYLENOL) 325 MG tablet Take 650 mg by mouth every 6 (six) hours as needed.   Yes Historical Provider, MD  ALPRAZolam Duanne Moron) 0.5 MG tablet Take 0.5 mg by mouth 2 (two) times daily as needed for anxiety.   Yes Historical Provider, MD  cholecalciferol (VITAMIN D) 400 UNITS TABS tablet Take 400 Units by mouth.   Yes Historical Provider, MD  esomeprazole (NEXIUM) 20 MG capsule Take 20 mg by mouth daily at 12 noon.   Yes Historical Provider, MD  Multiple Vitamin (MULTIVITAMIN) tablet Take 1 tablet by mouth daily.   Yes Historical Provider, MD  zolpidem (AMBIEN) 5 MG tablet Take 5 mg by mouth at bedtime as  needed for sleep.   Yes Historical Provider, MD  cephALEXin (KEFLEX) 500 MG capsule Take 1 capsule (500 mg total) by mouth 4 (four) times daily. 08/19/15   Veryl Speak, MD  doxycycline (VIBRAMYCIN) 100 MG capsule Take 1 capsule (100 mg total) by mouth 2 (two) times daily. 09/05/15   Stevi Barrett, PA-C  HYDROcodone-acetaminophen (NORCO/VICODIN) 5-325 MG tablet Take 2 tablets by mouth every 4 (four) hours as needed. 11/13/15   Tanna Furry, MD  meloxicam (MOBIC) 15 MG tablet Take 1 tablet (15 mg total) by mouth daily. Start day AFTER  finishing ibuprofen - make sure you take with food 07/22/15   Dene Gentry, MD  predniSONE (DELTASONE) 10 MG tablet 6 tabs po days 1-2, 5 tabs po days 3-4, 4 tabs po days 5-6, 3 tabs po days 7-8, 2 tabs po days 9-10, 1 tab po days 11-12 07/22/15   Dene Gentry, MD  sulfamethoxazole-trimethoprim (BACTRIM DS,SEPTRA DS) 800-160 MG tablet Take 2 tablets by mouth 2 (two) times daily. 11/13/15   Tanna Furry, MD  traMADol (ULTRAM) 50 MG tablet Take 1 tablet (50 mg total) by mouth every 6 (six) hours as needed. 07/22/15   Dene Gentry, MD   BP 103/79 mmHg  Pulse 113  Temp(Src) 98.5 F (36.9 C) (Oral)  Resp 18  Ht 5\' 7"  (1.702 m)  Wt 176 lb (79.833 kg)  BMI 27.56 kg/m2  SpO2 100% Physical Exam  Constitutional: She is oriented to person, place, and time. She appears well-developed and well-nourished. No distress.  HENT:  Head: Normocephalic.  Eyes: Conjunctivae are normal. Pupils are equal, round, and reactive to light. No scleral icterus.  Neck: Normal range of motion. Neck supple. No thyromegaly present.  Cardiovascular: Normal rate and regular rhythm.  Exam reveals no gallop and no friction rub.   No murmur heard. Pulmonary/Chest: Effort normal and breath sounds normal. No respiratory distress. She has no wheezes. She has no rales.  Abdominal: Soft. Bowel sounds are normal. She exhibits no distension. There is no tenderness. There is no rebound.  Musculoskeletal: Normal range of motion.  Neurological: She is alert and oriented to person, place, and time.  Skin: Skin is warm and dry. No rash noted.  Area of 6 by 2 cm erythema, with 1cm by 2 cm area of fluctuance  Psychiatric: She has a normal mood and affect. Her behavior is normal.  Nursing note and vitals reviewed.   ED Course  Procedures (including critical care time) DIAGNOSTIC STUDIES: Oxygen Saturation is 100% on RA, normal by my interpretation.    COORDINATION OF CARE: 6:51 PM Discussed treatment plan with pt at bedside which  includes incision and drainage and pt agreed to plan.   INCISION AND DRAINAGE PROCEDURE NOTE: Patient identification was confirmed and verbal consent was obtained. This procedure was performed by Tanna Furry, MD at 7:11 PM. Site: right axilla Sterile procedures observed Needle size: 27 gauge  Anesthetic used (type and amt): 1% lidocaine with epi Blade size: 11 Drainage: moderate amount of purulent   Complexity: Complex Packing used: 1/2 in. iodoform  Site anesthetized, incision made over site, wound drained and explored loculations, rinsed with copious amounts of normal saline, wound packed with sterile gauze, covered with dry, sterile dressing.  Pt tolerated procedure well without complications.  Instructions for care discussed verbally and pt provided with additional written instructions for homecare and f/u.   MDM   Final diagnoses:  Abscess    I personally performed the services described  in this documentation, which was scribed in my presence. The recorded information has been reviewed and is accurate.   Tanna Furry, MD 11/13/15 [phone]

## 2015-11-13 NOTE — ED Notes (Signed)
Pt reports that she has had mutiple abscess x 3 weeks, saw pcp, took antibiotics, however area under right upper axilla has gotten worse since last dose of antibiotics and more painful

## 2015-12-11 ENCOUNTER — Emergency Department (HOSPITAL_BASED_OUTPATIENT_CLINIC_OR_DEPARTMENT_OTHER)
Admission: EM | Admit: 2015-12-11 | Discharge: 2015-12-11 | Disposition: A | Discharge status: Home / Self Care | Payer: Medicare Other | Attending: Emergency Medicine | Admitting: Emergency Medicine

## 2015-12-11 ENCOUNTER — Encounter (HOSPITAL_BASED_OUTPATIENT_CLINIC_OR_DEPARTMENT_OTHER): Payer: Self-pay | Admitting: Emergency Medicine

## 2015-12-11 DIAGNOSIS — Y929 Unspecified place or not applicable: Secondary | ICD-10-CM | POA: Diagnosis not present

## 2015-12-11 DIAGNOSIS — F17210 Nicotine dependence, cigarettes, uncomplicated: Secondary | ICD-10-CM | POA: Insufficient documentation

## 2015-12-11 DIAGNOSIS — S032XXA Dislocation of tooth, initial encounter: Secondary | ICD-10-CM

## 2015-12-11 DIAGNOSIS — K0889 Other specified disorders of teeth and supporting structures: Secondary | ICD-10-CM | POA: Diagnosis present

## 2015-12-11 DIAGNOSIS — Y939 Activity, unspecified: Secondary | ICD-10-CM | POA: Insufficient documentation

## 2015-12-11 DIAGNOSIS — X58XXXA Exposure to other specified factors, initial encounter: Secondary | ICD-10-CM | POA: Diagnosis not present

## 2015-12-11 DIAGNOSIS — Y999 Unspecified external cause status: Secondary | ICD-10-CM | POA: Insufficient documentation

## 2015-12-11 MED ORDER — OXYCODONE-ACETAMINOPHEN 5-325 MG PO TABS
1.0000 | ORAL_TABLET | Freq: Once | ORAL | Status: AC
  Administered 2015-12-11: 1 via ORAL
  Filled 2015-12-11: qty 1

## 2015-12-11 MED ORDER — OXYCODONE-ACETAMINOPHEN 5-325 MG PO TABS: 2.0000 | ORAL_TABLET | ORAL | Status: DC | PRN

## 2015-12-11 MED FILL — OXYCODONE/APAP 5-325: 5-325 | 1 days supply | Qty: 10 | Fill #0

## 2015-12-11 NOTE — ED Notes (Signed)
Pt in c/o dental pain on R lower side x several days since tooth cracked. States has a Pharmacist, community but they were closed.

## 2015-12-11 NOTE — ED Provider Notes (Signed)
CSN: [id number]     Arrival date & time 12/11/15  1058 History   First MD Initiated Contact with Patient 12/11/15 1223     Chief Complaint  Patient presents with  . Dental Pain     (Consider location/radiation/quality/duration/timing/severity/associated sxs/prior Treatment) HPI   Michelle Hill is a 37 showed female with a past mental history of Crohn's disease, psoriasis and gastritis presents to the ED today complaining of dental pain. Patient states that 2 days ago she was eating macaroni salad and broke her right lower tooth. Since that time she's had severe pain and sensitivity to her tooth. She's been taking Tylenol without relief. She states she called her dentist but unfortunately she is unable to see her until the 10th. She has an appointment to see her dentist on Monday morning but do not feel that she could wait until then for any pain relief. She denies any facial swelling, gingival swelling, fevers, chills, difficulty breathing or swallowing. She is not currently on any immunosuppressants.   Past Medical History  Diagnosis Date  . Crohn's disease ([place])   . Psoriasis   . Gastroenteritis    Past Surgical History  Procedure Laterality Date  . Ileostomy    . Nephrostomy    . Kidney stent     Family History  Problem Relation Age of Onset  . Hyperlipidemia Father   . Hypertension Father   . Diabetes Neg Hx   . Heart attack Neg Hx   . Sudden death Neg Hx    Social History  Substance Use Topics  . Smoking status: Current Every Day Smoker -- 0.50 packs/day    Types: Cigarettes  . Smokeless tobacco: Never Used  . Alcohol Use: No     Comment: occ   OB History    No data available     Review of Systems  All other systems reviewed and are negative.     Allergies  Chocolate; Aspirin; and Ibuprofen  Home Medications   Prior to Admission medications   Medication Sig Start Date End Date Taking? Authorizing Provider  acetaminophen (TYLENOL) 325 MG tablet Take  650 mg by mouth every 6 (six) hours as needed.    Historical Provider, MD  ALPRAZolam Duanne Moron) 0.5 MG tablet Take 0.5 mg by mouth 2 (two) times daily as needed for anxiety.    Historical Provider, MD  cephALEXin (KEFLEX) 500 MG capsule Take 1 capsule (500 mg total) by mouth 4 (four) times daily. 08/19/15   Veryl Speak, MD  cholecalciferol (VITAMIN D) 400 UNITS TABS tablet Take 400 Units by mouth.    Historical Provider, MD  doxycycline (VIBRAMYCIN) 100 MG capsule Take 1 capsule (100 mg total) by mouth 2 (two) times daily. 09/05/15   Stevi Barrett, PA-C  esomeprazole (NEXIUM) 20 MG capsule Take 20 mg by mouth daily at 12 noon.    Historical Provider, MD  HYDROcodone-acetaminophen (NORCO/VICODIN) 5-325 MG tablet Take 2 tablets by mouth every 4 (four) hours as needed. 11/13/15   Tanna Furry, MD  meloxicam (MOBIC) 15 MG tablet Take 1 tablet (15 mg total) by mouth daily. Start day AFTER finishing ibuprofen - make sure you take with food 07/22/15   Dene Gentry, MD  Multiple Vitamin (MULTIVITAMIN) tablet Take 1 tablet by mouth daily.    Historical Provider, MD  predniSONE (DELTASONE) 10 MG tablet 6 tabs po days 1-2, 5 tabs po days 3-4, 4 tabs po days 5-6, 3 tabs po days 7-8, 2 tabs po days 9-10, 1  tab po days 11-12 07/22/15   Dene Gentry, MD  sulfamethoxazole-trimethoprim (BACTRIM DS,SEPTRA DS) 800-160 MG tablet Take 2 tablets by mouth 2 (two) times daily. 11/13/15   Tanna Furry, MD  traMADol (ULTRAM) 50 MG tablet Take 1 tablet (50 mg total) by mouth every 6 (six) hours as needed. 07/22/15   Dene Gentry, MD  zolpidem (AMBIEN) 5 MG tablet Take 5 mg by mouth at bedtime as needed for sleep.    Historical Provider, MD   BP 101/71 mmHg  Pulse 98  Temp(Src) 98.3 F (36.8 C) (Oral)  Resp 18  Ht 5\' 7"  (1.702 m)  Wt 83.008 kg  BMI 28.66 kg/m2  SpO2 100% Physical Exam  Constitutional: She is oriented to person, place, and time. She appears well-developed and well-nourished. No distress.  HENT:  Head:  Normocephalic and atraumatic.  Mouth/Throat: Oropharynx is clear and moist and mucous membranes are normal. No dental abscesses, uvula swelling or dental caries. No oropharyngeal exudate, posterior oropharyngeal edema, posterior oropharyngeal erythema or tonsillar abscesses.    No gingival erythema or swelling. No swelling under tongue or face.  Eyes: Conjunctivae are normal. Right eye exhibits no discharge. Left eye exhibits no discharge. No scleral icterus.  Cardiovascular: Normal rate.   Pulmonary/Chest: Effort normal.  Neurological: She is alert and oriented to person, place, and time. Coordination normal.  Skin: Skin is warm and dry. No rash noted. She is not diaphoretic. No erythema. No pallor.  Psychiatric: She has a normal mood and affect. Her behavior is normal.  Nursing note and vitals reviewed.   ED Course  Procedures (including critical care time) Labs Review Labs Reviewed - No data to display  Imaging Review No results found. I have personally reviewed and evaluated these images and lab results as part of my medical decision-making.   EKG Interpretation None      MDM   Final diagnoses:  Tooth avulsion, initial encounter    Patient presents the ED with pain in her tooth after cracking it while eating macaroni salad 2 days ago. There is a partial tooth avulsion noted on exam. No sign of infection at all. No gingival swelling or erythema. There is likely a nerve root exposure which is what is causing her pain. She has a scheduled with her dentist on Monday. We'll provide pain medication and interim. Do not feel that antibiotics is indicated at this point. She is not immunocompromised. Discussed this treatment plan with patient who is agreeable. Return precautions outlined in patient discharge instructions.    Dondra Spry Grady, PA-C 12/11/15 [place] Liu, MD 12/11/15 [phone]

## 2015-12-11 NOTE — Discharge Instructions (Signed)
Tooth Injuries Tooth injuries (tooth trauma) include cracked or broken teeth (fractures), teeth that have been moved out of place or dislodged (luxations), and knocked-out teeth (avulsions). A tooth injury often needs to be treated quickly to save the tooth. However, sometimes it is not possible to save a tooth after an injury, so the tooth may need to be removed (extracted). CAUSES Tooth injuries may be caused by any force that is strong enough to chip, break, dislodge, or knock out a tooth. Forces may be due to:  Sports injuries.  Falls.  Accidents.  Fights. RISK FACTORS You may be more likely to injure a tooth if you play a contact sport without using a mouthguard. SYMPTOMS A tooth that is forced into the gum may appear dislodged or moved out of position into the tooth socket. A fractured tooth may not be as obvious. Symptoms of a tooth injury include:  Pain, especially with chewing.  A loose tooth.  Bleeding in or around the tooth.  Swelling or bruising near the tooth.  Swelling or bruising of the lip over the injured tooth.  Increased sensitivity to heat and cold. DIAGNOSIS A tooth injury can be diagnosed with a medical history and a physical exam. You may also need dental X-rays to check for injuries to the root of the tooth. TREATMENT Treatment depends on the type of injury you have and how bad it is. Treatment may need to be done quickly to save your tooth. Possible treatments include:  Replacing a tooth fragment with a filling, cap, or hard, protective cover (crown). This may be an option for a chip or fracture that does not involve the inside of your tooth (pulp).  Having a procedure to repair the inside of the tooth (root canal) and then having a crown placed on top. This may be done to treat a tooth fracture that involves the pulp.  Repositioning a dislodged tooth, then doing a root canal. The root canal usually needs to be done within a few days of the  injury.  Replacing a knocked-out tooth in the socket, if possible, then doing a root canal a few weeks later.  Tooth extraction for a fracture that extends below your gumline or splits your tooth completely. HOME CARE INSTRUCTIONS  Take medicines only as directed by your dental provider or health care provider.  Keep all follow-up visits as directed by your dental provider or health care provider. This is important.  Do not eat or chew on very hard objects. These include ice cubes, pens, pencils, hard candy, and popcorn kernels.  Do not clench or grind your teeth. Tell your dental provider or health care provider if you grind your teeth while you sleep.  Apply ice to your mouth near the injured tooth as directed by your dental provider or health care provider.  Follow instructions about rinsing your mouth with salt water as directed by your dental provider or health care provider.  Do not use your teeth to open packages.  Always wear mouth protection when you play contact sports. SEEK MEDICAL CARE IF:  You continue to have tooth pain after a tooth injury.  Your tooth is sensitive to heat and cold.  You develop swelling near your injured tooth.  You have a fever.  You are unable to open your jaw.  You are drooling and it is getting worse.   This information is not intended to replace advice given to you by your health care provider. Make sure you discuss any  questions you have with your health care provider.   Keep scheduled appointment with your dentist for Monday. Take Percocet as needed for pain. Avoid taking additional Tylenol. Return to the ED if you experience significant facial swelling, difficulty breathing, difficulty swallowing, fevers or chills.

## 2016-03-03 ENCOUNTER — Emergency Department (HOSPITAL_BASED_OUTPATIENT_CLINIC_OR_DEPARTMENT_OTHER)
Admission: EM | Admit: 2016-03-03 | Discharge: 2016-03-03 | Disposition: A | Discharge status: Home / Self Care | Payer: Medicare Other | Attending: Emergency Medicine | Admitting: Emergency Medicine

## 2016-03-03 ENCOUNTER — Encounter (HOSPITAL_BASED_OUTPATIENT_CLINIC_OR_DEPARTMENT_OTHER): Payer: Self-pay | Admitting: Respiratory Therapy

## 2016-03-03 ENCOUNTER — Emergency Department (HOSPITAL_BASED_OUTPATIENT_CLINIC_OR_DEPARTMENT_OTHER): Payer: Medicare Other

## 2016-03-03 DIAGNOSIS — Z79899 Other long term (current) drug therapy: Secondary | ICD-10-CM | POA: Diagnosis not present

## 2016-03-03 DIAGNOSIS — R05 Cough: Secondary | ICD-10-CM | POA: Diagnosis present

## 2016-03-03 DIAGNOSIS — F17210 Nicotine dependence, cigarettes, uncomplicated: Secondary | ICD-10-CM | POA: Insufficient documentation

## 2016-03-03 DIAGNOSIS — J189 Pneumonia, unspecified organism: Secondary | ICD-10-CM | POA: Insufficient documentation

## 2016-03-03 DIAGNOSIS — Z85820 Personal history of malignant melanoma of skin: Secondary | ICD-10-CM | POA: Diagnosis not present

## 2016-03-03 LAB — BASIC METABOLIC PANEL
ANION GAP: 9 (ref 5–15)
BUN: 13 mg/dL (ref 6–20)
CALCIUM: 9.3 mg/dL (ref 8.9–10.3)
CO2: 20 mmol/L — ABNORMAL LOW (ref 22–32)
Chloride: 109 mmol/L (ref 101–111)
Creatinine, Ser: 0.69 mg/dL (ref 0.44–1.00)
GLUCOSE: 108 mg/dL — AB (ref 65–99)
POTASSIUM: 4.1 mmol/L (ref 3.5–5.1)
Sodium: 138 mmol/L (ref 135–145)

## 2016-03-03 LAB — CBC WITH DIFFERENTIAL/PLATELET
BASOS ABS: 0 10*3/uL (ref 0.0–0.1)
Basophils Relative: 0 %
EOS PCT: 1 %
Eosinophils Absolute: 0.2 10*3/uL (ref 0.0–0.7)
HEMATOCRIT: 38.7 % (ref 36.0–46.0)
Hemoglobin: 13.1 g/dL (ref 12.0–15.0)
LYMPHS PCT: 12 %
Lymphs Abs: 1.8 10*3/uL (ref 0.7–4.0)
MCH: 32.4 pg (ref 26.0–34.0)
MCHC: 33.9 g/dL (ref 30.0–36.0)
MCV: 95.8 fL (ref 78.0–100.0)
MONO ABS: 0.9 10*3/uL (ref 0.1–1.0)
MONOS PCT: 6 %
NEUTROS ABS: 12.2 10*3/uL — AB (ref 1.7–7.7)
Neutrophils Relative %: 81 %
PLATELETS: 341 10*3/uL (ref 150–400)
RBC: 4.04 MIL/uL (ref 3.87–5.11)
RDW: 13.3 % (ref 11.5–15.5)
WBC: 15 10*3/uL — ABNORMAL HIGH (ref 4.0–10.5)

## 2016-03-03 LAB — RAPID STREP SCREEN (MED CTR MEBANE ONLY): STREPTOCOCCUS, GROUP A SCREEN (DIRECT): NEGATIVE

## 2016-03-03 MED ORDER — TRAMADOL HCL 50 MG PO TABS
50.0000 mg | ORAL_TABLET | Freq: Once | ORAL | Status: DC
  Administered 2016-03-03: 50 mg via ORAL
  Filled 2016-03-03: qty 1

## 2016-03-03 MED ORDER — BENZONATATE 100 MG PO CAPS
200.0000 mg | ORAL_CAPSULE | Freq: Once | ORAL | Status: AC
  Administered 2016-03-03: 200 mg via ORAL
  Filled 2016-03-03: qty 2

## 2016-03-03 MED ORDER — TRAMADOL HCL 50 MG PO TABS: 50.0000 mg | ORAL_TABLET | Freq: Once | ORAL | Status: DC

## 2016-03-03 MED ORDER — AZITHROMYCIN 250 MG PO TABS
500.0000 mg | ORAL_TABLET | Freq: Once | ORAL | Status: AC
  Administered 2016-03-03: 500 mg via ORAL
  Filled 2016-03-03: qty 2

## 2016-03-03 MED ORDER — BENZONATATE 100 MG PO CAPS: 100.0000 mg | ORAL_CAPSULE | Freq: Three times a day (TID) | ORAL | 0 refills | Status: DC | PRN

## 2016-03-03 MED ORDER — SODIUM CHLORIDE 0.9 % IV BOLUS (SEPSIS)
1000.0000 mL | Freq: Once | INTRAVENOUS | Status: AC
  Administered 2016-03-03: 1000 mL via INTRAVENOUS

## 2016-03-03 MED ORDER — AZITHROMYCIN 250 MG PO TABS: 250.0000 mg | ORAL_TABLET | Freq: Every day | ORAL | 0 refills | Status: DC

## 2016-03-03 NOTE — ED Triage Notes (Signed)
Pt reports cough x 1 day.  Denies fever.

## 2016-03-03 NOTE — ED Provider Notes (Signed)
[facility] DEPT [facility] Provider Note   CSN: [id number] Arrival date & time: 03/03/16  0100     History   Chief Complaint Chief Complaint  Patient presents with  . Cough    HPI Michelle Hill is a 35 y.o. female with no second past medical history presenting today for viral URI like symptoms. Patient states for the past 3 days she has had subjective fevers and chills. She's had a productive cough of green sputum. Her cough causes worsening chest and back pain. Patient states she's had rhinorrhea and a sore throat as well. She is taking over-the-counter Tylenol without any significant relief. She denies any sick contacts. She admits to episodes of posttussive emesis. There are no further complaints.  10 Systems reviewed and are negative for acute change except as noted in the HPI.    HPI  Past Medical History:  Diagnosis Date  . Crohn's disease ([place])   . Gastroenteritis   . Psoriasis     Patient Active Problem List   Diagnosis Date Noted  . Anxiety 07/10/2015  . Clinical depression 07/10/2015  . Right ankle pain 07/10/2015  . 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 Malignant melanoma ([place]) 08/08/2013  . Excess weight 08/08/2013  . Crohn's disease of colon ([place]) 08/06/2013  . Neck pain 07/25/2013  . Psoriasis 02/10/2012  . Hidradenitis 08/09/2011  . Left shoulder pain 07/29/2011    Past Surgical History:  Procedure Laterality Date  . ILEOSTOMY    . kidney stent    . NEPHROSTOMY      OB History    No data available       Home Medications    Prior to Admission medications   Medication Sig Start Date End Date Taking? Authorizing Provider  acetaminophen (TYLENOL) 325 MG tablet Take 650 mg by mouth every 6 (six) hours as needed.    Historical Provider, MD  azithromycin (ZITHROMAX) 250 MG tablet Take 1 tablet (250 mg total) by mouth daily. 03/03/16   Everlene Balls, MD  cholecalciferol (VITAMIN D) 400 UNITS TABS tablet Take 400 Units by mouth.    Historical Provider, MD   esomeprazole (NEXIUM) 20 MG capsule Take 20 mg by mouth daily at 12 noon.    Historical Provider, MD  Multiple Vitamin (MULTIVITAMIN) tablet Take 1 tablet by mouth daily.    Historical Provider, MD  zolpidem (AMBIEN) 5 MG tablet Take 5 mg by mouth at bedtime as needed for sleep.    Historical Provider, MD    Family History Family History  Problem Relation Age of Onset  . Hyperlipidemia Father   . Hypertension Father   . Diabetes Neg Hx   . Heart attack Neg Hx   . Sudden death Neg Hx     Social History Social History  Substance Use Topics  . Smoking status: Current Every Day Smoker    Packs/day: 0.50    Types: Cigarettes  . Smokeless tobacco: Never Used  . Alcohol use No     Comment: occ     Allergies   Chocolate; Aspirin; and Ibuprofen   Review of Systems Review of Systems   Physical Exam Updated Vital Signs BP 104/78 (BP Location: Left Arm)   Pulse 111   Temp 98.8 F (37.1 C)   Resp 20   Ht 5\' 7"  (1.702 m)   Wt 180 lb (81.6 kg)   SpO2 99%   BMI 28.19 kg/m   Physical Exam  Constitutional: She is oriented to person, place, and time. She  appears well-developed and well-nourished. No distress.  Intermittent coughing on examination.  HENT:  Head: Normocephalic and atraumatic.  Nose: Nose normal.  Mouth/Throat: Oropharynx is clear and moist. No oropharyngeal exudate.  Eyes: Conjunctivae and EOM are normal. Pupils are equal, round, and reactive to light. No scleral icterus.  Neck: Normal range of motion. Neck supple. No JVD present. No tracheal deviation present. No thyromegaly present.  Cardiovascular: Normal rate, regular rhythm and normal heart sounds.  Exam reveals no gallop and no friction rub.   No murmur heard. Pulmonary/Chest: Effort normal and breath sounds normal. No respiratory distress. She has no wheezes. She exhibits no tenderness.  Abdominal: Soft. Bowel sounds are normal. She exhibits no distension and no mass. There is no tenderness. There is  no rebound and no guarding.  Musculoskeletal: Normal range of motion. She exhibits no edema or tenderness.  Lymphadenopathy:    She has no cervical adenopathy.  Neurological: She is alert and oriented to person, place, and time. No cranial nerve deficit. She exhibits normal muscle tone.  Skin: Skin is warm and dry. No rash noted. No erythema. No pallor.  Tactile fever  Nursing note and vitals reviewed.    ED Treatments / Results  Labs (all labs ordered are listed, but only abnormal results are displayed) Labs Reviewed  CBC WITH DIFFERENTIAL/PLATELET - Abnormal; Notable for the following:       Result Value   WBC 15.0 (*)    Neutro Abs 12.2 (*)    All other components within normal limits  BASIC METABOLIC PANEL - Abnormal; Notable for the following:    CO2 20 (*)    Glucose, Bld 108 (*)    All other components within normal limits  RAPID STREP SCREEN (NOT AT [facility])  CULTURE, GROUP A STREP [facility])    EKG  EKG Interpretation None       Radiology Dg Chest 2 View  Result Date: 03/03/2016 CLINICAL DATA:  35 year old female with fever and cough EXAM: CHEST  2 VIEW COMPARISON:  Chest radiograph dated 03/17/2014 FINDINGS: The heart size and mediastinal contours are within normal limits. Both lungs are clear. The visualized skeletal structures are unremarkable. IMPRESSION: No active cardiopulmonary disease. Electronically Signed   By: Anner Crete M.D.   On: 03/03/2016 03:45    Procedures Procedures (including critical care time)  Medications Ordered in ED Medications  azithromycin (ZITHROMAX) tablet 500 mg (not administered)  sodium chloride 0.9 % bolus 1,000 mL (1,000 mLs Intravenous New Bag/Given 03/03/16 0308)  benzonatate (TESSALON) capsule 200 mg (200 mg Oral Given 03/03/16 0244)     Initial Impression / Assessment and Plan / ED Course  I have reviewed the triage vital signs and the nursing notes.  Pertinent labs & imaging results that were available during my care  of the patient were reviewed by me and considered in my medical decision making (see chart for details).  Clinical Course    Patient presents to emergency department for productive cough and fever. Will obtain x-ray for evaluation. She is febrile and tachycardic, lab studies are pending. She was given IV fluids, Tessalon Perles, Tylenol for her symptoms. We'll continue to monitor.  3:48 AM CXR negative for pneumonia, however WBC is 15 and clinically her history and exam are consistent.  Will treat with 5 day course of azithro and encourage PCP fu within 3 days. She appears well and in NAD. VS remain within her normal limits, tachycardia has resolved.  Patient safe for DC.  Final Clinical  Impressions(s) / ED Diagnoses   Final diagnoses:  Community acquired pneumonia    New Prescriptions New Prescriptions   AZITHROMYCIN (ZITHROMAX) 250 MG TABLET    Take 1 tablet (250 mg total) by mouth daily.     Everlene Balls, MD 03/03/16 [id number]

## 2016-03-05 LAB — CULTURE, GROUP A STREP (THRC)

## 2016-04-16 ENCOUNTER — Encounter (HOSPITAL_BASED_OUTPATIENT_CLINIC_OR_DEPARTMENT_OTHER): Payer: Self-pay | Admitting: Emergency Medicine

## 2016-04-16 ENCOUNTER — Emergency Department (HOSPITAL_BASED_OUTPATIENT_CLINIC_OR_DEPARTMENT_OTHER)
Admission: EM | Admit: 2016-04-16 | Discharge: 2016-04-16 | Disposition: A | Discharge status: Home / Self Care | Payer: Medicare Other | Attending: Emergency Medicine | Admitting: Emergency Medicine

## 2016-04-16 DIAGNOSIS — Z79899 Other long term (current) drug therapy: Secondary | ICD-10-CM | POA: Insufficient documentation

## 2016-04-16 DIAGNOSIS — R21 Rash and other nonspecific skin eruption: Secondary | ICD-10-CM

## 2016-04-16 DIAGNOSIS — L732 Hidradenitis suppurativa: Secondary | ICD-10-CM | POA: Insufficient documentation

## 2016-04-16 DIAGNOSIS — Z85820 Personal history of malignant melanoma of skin: Secondary | ICD-10-CM | POA: Insufficient documentation

## 2016-04-16 DIAGNOSIS — F17210 Nicotine dependence, cigarettes, uncomplicated: Secondary | ICD-10-CM | POA: Diagnosis not present

## 2016-04-16 MED ORDER — TRAMADOL HCL 50 MG PO TABS: 50.0000 mg | ORAL_TABLET | Freq: Four times a day (QID) | ORAL | 0 refills | Status: DC | PRN

## 2016-04-16 MED ORDER — DOXYCYCLINE HYCLATE 100 MG PO CAPS: 100.0000 mg | ORAL_CAPSULE | Freq: Two times a day (BID) | ORAL | 0 refills | Status: DC

## 2016-04-16 MED ORDER — NYSTATIN 100000 UNIT/GM EX CREA: TOPICAL_CREAM | CUTANEOUS | 0 refills | Status: DC

## 2016-04-16 MED FILL — DOXYCYCLINE HYC 100 MG CAP: 100 | 10 days supply | Qty: 20 | Fill #0

## 2016-04-16 MED FILL — NYSTATIN 100,000 UNIT/GM CR: 100000 | 10 days supply | Qty: 30 | Fill #0

## 2016-04-16 MED FILL — traMADol HCL 50 MG TABS: 50 | 3 days supply | Qty: 15 | Fill #0

## 2016-04-16 NOTE — ED Provider Notes (Signed)
[facility] DEPT [facility] Provider Note   CSN: [id number] Arrival date & time: 04/16/16  1057     History   Chief Complaint Chief Complaint  Patient presents with  . Abscess  . Rash    HPI Michelle Hill is a 35 y.o. female.  HPI Michelle Hill is a 35 y.o. female with PMH significant for Crohn's disease with colostomy, hidradenitis, and psoriasis who presents with 2 complaints.  She reports gradual onset, constant, worsening pain to her right axilla and is concerned she has an abscess.  No drainage.  She has taken Tylenol for pain.  She states she is unable to take Ibuprofen or NSAIDs due to stomach problems.  She also complains to pruritic, warm, erythematous rash to her pannus.  No drainage.  No vaginal complaints.  No new soaps, detergents, lotions, or medications.  She states she had a fever a while ago, but none recently.    Past Medical History:  Diagnosis Date  . Crohn's disease ([place])   . Gastroenteritis   . Psoriasis     Patient Active Problem List   Diagnosis Date Noted  . Anxiety 07/10/2015  . Clinical depression 07/10/2015  . Right ankle pain 07/10/2015  . 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 Malignant melanoma ([place]) 08/08/2013  . Excess weight 08/08/2013  . Crohn's disease of colon ([place]) 08/06/2013  . Neck pain 07/25/2013  . Psoriasis 02/10/2012  . Hidradenitis 08/09/2011  . Left shoulder pain 07/29/2011    Past Surgical History:  Procedure Laterality Date  . ABDOMINAL SURGERY    . ILEOSTOMY    . kidney stent    . NEPHROSTOMY      OB History    No data available       Home Medications    Prior to Admission medications   Medication Sig Start Date End Date Taking? Authorizing Provider  medroxyPROGESTERone (DEPO-PROVERA) 150 MG/ML injection Inject 150 mg into the muscle every 3 (three) months.   Yes Historical Provider, MD  acetaminophen (TYLENOL) 325 MG tablet Take 650 mg by mouth every 6 (six) hours as needed.    Historical Provider, MD    cholecalciferol (VITAMIN D) 400 UNITS TABS tablet Take 400 Units by mouth.    Historical Provider, MD  doxycycline (VIBRAMYCIN) 100 MG capsule Take 1 capsule (100 mg total) by mouth 2 (two) times daily. 04/16/16   Gloriann Loan, PA-C  esomeprazole (NEXIUM) 20 MG capsule Take 20 mg by mouth daily at 12 noon.    Historical Provider, MD  Multiple Vitamin (MULTIVITAMIN) tablet Take 1 tablet by mouth daily.    Historical Provider, MD  nystatin cream (MYCOSTATIN) Apply to affected area 2 times daily until resolution 04/16/16   Gloriann Loan, PA-C  traMADol (ULTRAM) 50 MG tablet Take 1 tablet (50 mg total) by mouth every 6 (six) hours as needed. 04/16/16   Gloriann Loan, PA-C  zolpidem (AMBIEN) 5 MG tablet Take 5 mg by mouth at bedtime as needed for sleep.    Historical Provider, MD    Family History Family History  Problem Relation Age of Onset  . Hyperlipidemia Father   . Hypertension Father   . Diabetes Neg Hx   . Heart attack Neg Hx   . Sudden death Neg Hx     Social History Social History  Substance Use Topics  . Smoking status: Current Every Day Smoker    Packs/day: 0.50    Types: Cigarettes  . Smokeless tobacco: Never Used  . Alcohol use  No     Comment: occ     Allergies   Chocolate; Aspirin; and Ibuprofen   Review of Systems Review of Systems All other systems negative unless otherwise stated in HPI   Physical Exam Updated Vital Signs BP 93/77 (BP Location: Left Arm)   Pulse 97   Temp 97.8 F (36.6 C) (Oral)   Resp 18   Ht 5\' 7"  (1.702 m)   Wt 83 kg   SpO2 100%   BMI 28.66 kg/m   Physical Exam  Constitutional: She is oriented to person, place, and time. She appears well-developed and well-nourished.  HENT:  Head: Normocephalic and atraumatic.  Right Ear: External ear normal.  Left Ear: External ear normal.  Eyes: Conjunctivae are normal. No scleral icterus.  Neck: No tracheal deviation present.  Pulmonary/Chest: Effort normal. No respiratory distress.   Abdominal: She exhibits no distension.  Colostomy bag without signs of surrounding irritation or infection.   Musculoskeletal: Normal range of motion.  Neurological: She is alert and oriented to person, place, and time.  Skin: Skin is warm and dry.  Warm, erythematous, macular rash over entire pannus without induration, fluctuance, or drainage. Right axilla with multiple inflamed tender nodules without induration, fluctuance, or erythema.   Psychiatric: She has a normal mood and affect. Her behavior is normal.     ED Treatments / Results  Labs (all labs ordered are listed, but only abnormal results are displayed) Labs Reviewed - No data to display  EKG  EKG Interpretation None       Radiology No results found.  Procedures Procedures (including critical care time)  Medications Ordered in ED Medications - No data to display   Initial Impression / Assessment and Plan / ED Course  I have reviewed the triage vital signs and the nursing notes.  Pertinent labs & imaging results that were available during my care of the patient were reviewed by me and considered in my medical decision making (see chart for details).  Clinical Course    Patient presents with findings c/w hidradenitis suppurativa without evidence of abscess.  No indication for I&D at this time.  Also rash on pannus concerning for possible fungal etiology.  Vitals reassuring.  Patient appears well, non-toxic or ill.  Do not suspect sepsis or severe bacteremia. No evidence of severe allergic reaction.  Plan to treat with Doxycycline and topical nystatin.  Recommend Benadryl for itching and short course of Tramadol for pain.  Will have follow up with Dermatologist.  Return precautions discussed.  Stable for discharge.    Final Clinical Impressions(s) / ED Diagnoses   Final diagnoses:  Rash  Hidradenitis suppurativa of right axilla    New Prescriptions New Prescriptions   DOXYCYCLINE (VIBRAMYCIN) 100 MG  CAPSULE    Take 1 capsule (100 mg total) by mouth 2 (two) times daily.   NYSTATIN CREAM (MYCOSTATIN)    Apply to affected area 2 times daily until resolution   TRAMADOL (ULTRAM) 50 MG TABLET    Take 1 tablet (50 mg total) by mouth every 6 (six) hours as needed.     Gloriann Loan, PA-C 04/16/16 [place], PA-C 04/16/16 Othello Liu, MD 04/16/16 1534

## 2016-04-16 NOTE — Discharge Instructions (Signed)
Start taking Doxycycline to treat for infection under your arm.  Apply Nystatin twice daily to groin rash until it resolves.  Take benadryl every 6 hours as needed for itching.  You may take Tramadol every 6 hours as needed for severe pain.  Call the Dermatologist.  Return to the ED if rash gets worse, area under your arm gets larger, uncontrolled pain, persistent fever, or any new or concerning symptoms.

## 2016-04-16 NOTE — ED Triage Notes (Signed)
Abscess under right armpit for over one week.  Rash to groin area for last three days.  Denies vaginal discharge.

## 2016-07-12 ENCOUNTER — Ambulatory Visit: Payer: Medicare Other | Admitting: Family Medicine

## 2016-09-27 ENCOUNTER — Encounter (HOSPITAL_BASED_OUTPATIENT_CLINIC_OR_DEPARTMENT_OTHER): Payer: Self-pay | Admitting: *Deleted

## 2016-09-27 ENCOUNTER — Emergency Department (HOSPITAL_BASED_OUTPATIENT_CLINIC_OR_DEPARTMENT_OTHER)
Admission: EM | Admit: 2016-09-27 | Discharge: 2016-09-27 | Disposition: A | Discharge status: Home / Self Care | Payer: Medicare Other | Attending: Emergency Medicine | Admitting: Emergency Medicine

## 2016-09-27 DIAGNOSIS — L02215 Cutaneous abscess of perineum: Secondary | ICD-10-CM | POA: Insufficient documentation

## 2016-09-27 DIAGNOSIS — L0291 Cutaneous abscess, unspecified: Secondary | ICD-10-CM

## 2016-09-27 DIAGNOSIS — Z79899 Other long term (current) drug therapy: Secondary | ICD-10-CM | POA: Diagnosis not present

## 2016-09-27 DIAGNOSIS — F17210 Nicotine dependence, cigarettes, uncomplicated: Secondary | ICD-10-CM | POA: Insufficient documentation

## 2016-09-27 MED ORDER — LIDOCAINE HCL (PF) 1 % IJ SOLN
5.0000 mL | Freq: Once | INTRAMUSCULAR | Status: AC
  Administered 2016-09-27: 5 mL
  Filled 2016-09-27: qty 5

## 2016-09-27 MED ORDER — SULFAMETHOXAZOLE-TRIMETHOPRIM 800-160 MG PO TABS: 1.0000 | ORAL_TABLET | Freq: Two times a day (BID) | ORAL | 0 refills | Status: AC

## 2016-09-27 MED ORDER — HYDROCODONE-ACETAMINOPHEN 5-325 MG PO TABS: 1.0000 | ORAL_TABLET | ORAL | 0 refills | Status: DC | PRN

## 2016-09-27 MED FILL — SULFAMETHOXAZOLE/TMP DS TAB: 800-160 | 7 days supply | Qty: 14 | Fill #0

## 2016-09-27 MED FILL — HYDROCODON-APAP 5-325: 5-325 | 2 days supply | Qty: 8 | Fill #0

## 2016-09-27 NOTE — ED Provider Notes (Signed)
[facility] DEPT [facility] Provider Note   CSN: [id number] Arrival date & time: 09/27/16  1014     History   Chief Complaint Chief Complaint  Patient presents with  . Abscess    HPI Michelle Hill is a 36 y.o. female.  HPI   Pt with hx hidradenitis suppurativa p/w abscess of left labia.  Has had multiple I&Ds for same.  Had recent right axillary abscess that drained with warm compress and is resolving.  Left labial abscess began two days ago.  She has used warm moist compresses without drainage or improvement.  Associated subjective fevers.   Pt also has hx ileostomy from crohn's disease, stable.    Past Medical History:  Diagnosis Date  . Crohn's disease ([place])   . Gastroenteritis   . Psoriasis     Patient Active Problem List   Diagnosis Date Noted  . Anxiety 07/10/2015  . Clinical depression 07/10/2015  . Right ankle pain 07/10/2015  . 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 Malignant melanoma ([place]) 08/08/2013  . Excess weight 08/08/2013  . Crohn's disease of colon ([place]) 08/06/2013  . Neck pain 07/25/2013  . Psoriasis 02/10/2012  . Hidradenitis 08/09/2011  . Left shoulder pain 07/29/2011    Past Surgical History:  Procedure Laterality Date  . ABDOMINAL SURGERY    . ILEOSTOMY    . kidney stent    . NEPHROSTOMY      OB History    No data available       Home Medications    Prior to Admission medications   Medication Sig Start Date End Date Taking? Authorizing Provider  acetaminophen (TYLENOL) 325 MG tablet Take 650 mg by mouth every 6 (six) hours as needed.   Yes Historical Provider, MD  cholecalciferol (VITAMIN D) 400 UNITS TABS tablet Take 400 Units by mouth.   Yes Historical Provider, MD  esomeprazole (NEXIUM) 20 MG capsule Take 20 mg by mouth daily at 12 noon.   Yes Historical Provider, MD  medroxyPROGESTERone (DEPO-PROVERA) 150 MG/ML injection Inject 150 mg into the muscle every 3 (three) months.   Yes Historical Provider, MD  Multiple Vitamin  (MULTIVITAMIN) tablet Take 1 tablet by mouth daily.   Yes Historical Provider, MD  HYDROcodone-acetaminophen (NORCO/VICODIN) 5-325 MG tablet Take 1 tablet by mouth every 4 (four) hours as needed for moderate pain or severe pain. 09/27/16   [name], PA-C  nystatin cream (MYCOSTATIN) Apply to affected area 2 times daily until resolution 04/16/16   Gloriann Loan, PA-C  sulfamethoxazole-trimethoprim (BACTRIM DS,SEPTRA DS) 800-160 MG tablet Take 1 tablet by mouth 2 (two) times daily. 09/27/16 10/04/16  [name], PA-C  traMADol (ULTRAM) 50 MG tablet Take 1 tablet (50 mg total) by mouth every 6 (six) hours as needed. 04/16/16   Gloriann Loan, PA-C  zolpidem (AMBIEN) 5 MG tablet Take 5 mg by mouth at bedtime as needed for sleep.    Historical Provider, MD    Family History Family History  Problem Relation Age of Onset  . Hyperlipidemia Father   . Hypertension Father   . Diabetes Neg Hx   . Heart attack Neg Hx   . Sudden death Neg Hx     Social History Social History  Substance Use Topics  . Smoking status: Current Every Day Smoker    Packs/day: 0.50    Types: Cigarettes  . Smokeless tobacco: Never Used  . Alcohol use No     Comment: occ     Allergies   Chocolate; Aspirin; and  Ibuprofen   Review of Systems Review of Systems  Constitutional: Positive for fever.  Gastrointestinal: Negative for abdominal pain and vomiting.  Genitourinary: Negative for dysuria and frequency.  Skin: Negative for color change, pallor, rash and wound.  Allergic/Immunologic: Negative for immunocompromised state.  Neurological: Negative for weakness and numbness.  Hematological: Does not bruise/bleed easily.  Psychiatric/Behavioral: Negative for self-injury.     Physical Exam Updated Vital Signs BP 115/85 (BP Location: Right Arm)   Pulse 80   Temp 99 F (37.2 C) (Oral)   Resp 18   Ht 5\' 7"  (1.702 m)   Wt 78.9 kg   SpO2 100%   BMI 27.25 kg/m   Physical Exam  Constitutional: She appears  well-developed and well-nourished. No distress.  HENT:  Head: Normocephalic and atraumatic.  Neck: Neck supple.  Cardiovascular: Normal rate and regular rhythm.   Pulmonary/Chest: Effort normal and breath sounds normal. No respiratory distress. She has no wheezes. She has no rales.  Abdominal: Soft. She exhibits no distension. There is no tenderness. There is no rebound and no guarding.  Ileostomy in place.  Extensive abdominal surgical scarring, well healed   Genitourinary:  Genitourinary Comments: Left pubic mons with fluctuant abscess, tender to palpation.  No significant erythema.    Neurological: She is alert.  Skin: She is not diaphoretic.  Nursing note and vitals reviewed.    ED Treatments / Results  Labs (all labs ordered are listed, but only abnormal results are displayed) Labs Reviewed - No data to display  EKG  EKG Interpretation None       Radiology No results found.  Procedures Procedures (including critical care time)  INCISION AND DRAINAGE Performed by: [name] Consent: Verbal consent obtained. Risks and benefits: risks, benefits and alternatives were discussed Type: abscess  Body area: left pubic mons  Anesthesia: local infiltration  Incision was made with a scalpel.  Local anesthetic: lidocaine 1% no epinephrine  Anesthetic total: 3 ml  Complexity: complex Blunt dissection to break up loculations  Drainage: purulent  Drainage amount: moderate  Packing material: none  Flushed with normal saline   Patient tolerance: Patient tolerated the procedure well with no immediate complications.     Medications Ordered in ED Medications  lidocaine (PF) (XYLOCAINE) 1 % injection 5 mL (5 mLs Infiltration Given by Other 09/27/16 1200)     Initial Impression / Assessment and Plan / ED Course  I have reviewed the triage vital signs and the nursing notes.  Pertinent labs & imaging results that were available during my care of the patient were  reviewed by me and considered in my medical decision making (see chart for details).     Afebrile, nontoxic patient with hx hidradenitis suppurativa p/w left groin abscess.  I&D in department without complication.  No overlying cellulitis.   D/C home with norco, bactrim.  Discussed result, findings, treatment, and follow up  with patient.  Pt given return precautions.  Pt verbalizes understanding and agrees with plan.       Final Clinical Impressions(s) / ED Diagnoses   Final diagnoses:  Abscess    New Prescriptions Discharge Medication List as of 09/27/2016  1:13 PM    START taking these medications   Details  HYDROcodone-acetaminophen (NORCO/VICODIN) 5-325 MG tablet Take 1 tablet by mouth every 4 (four) hours as needed for moderate pain or severe pain., Starting Mon 09/27/2016, Print    sulfamethoxazole-trimethoprim (BACTRIM DS,SEPTRA DS) 800-160 MG tablet Take 1 tablet by mouth 2 (two) times daily.,  Starting Mon 09/27/2016, Until Mon 10/04/2016, Print         [place], [place] 09/27/16 [place], MD 09/27/16 1539

## 2016-09-27 NOTE — ED Notes (Signed)
Pt teaching provided on medications that may cause drowsiness. Pt instructed not to drive or operate heavy machinery while taking the prescribed medication. Pt verbalized understanding.   

## 2016-09-27 NOTE — Discharge Instructions (Signed)
Read the information below.  Use the prescribed medication as directed.  Please discuss all new medications with your pharmacist.  You may return to the Emergency Department at any time for worsening condition or any new symptoms that concern you.    If you develop redness, swelling, increased pain, or fevers greater than 100.4, return to the ER immediately for a recheck.   °

## 2016-09-27 NOTE — ED Triage Notes (Signed)
Abscess to her left labia x 2 days.

## 2016-11-22 ENCOUNTER — Ambulatory Visit: Payer: Medicare Other | Admitting: Emergency Medicine

## 2016-12-11 IMAGING — CR DG ANKLE COMPLETE 3+V*R*
3 series · 3 of 3 positions shown · non-contrast
Comparison: 11/10/2012

CLINICAL DATA: Right lateral ankle pain shooting into right calf
for 1 week.

EXAM:
RIGHT ANKLE - COMPLETE 3+ VIEW

[t ankle joint ap right]
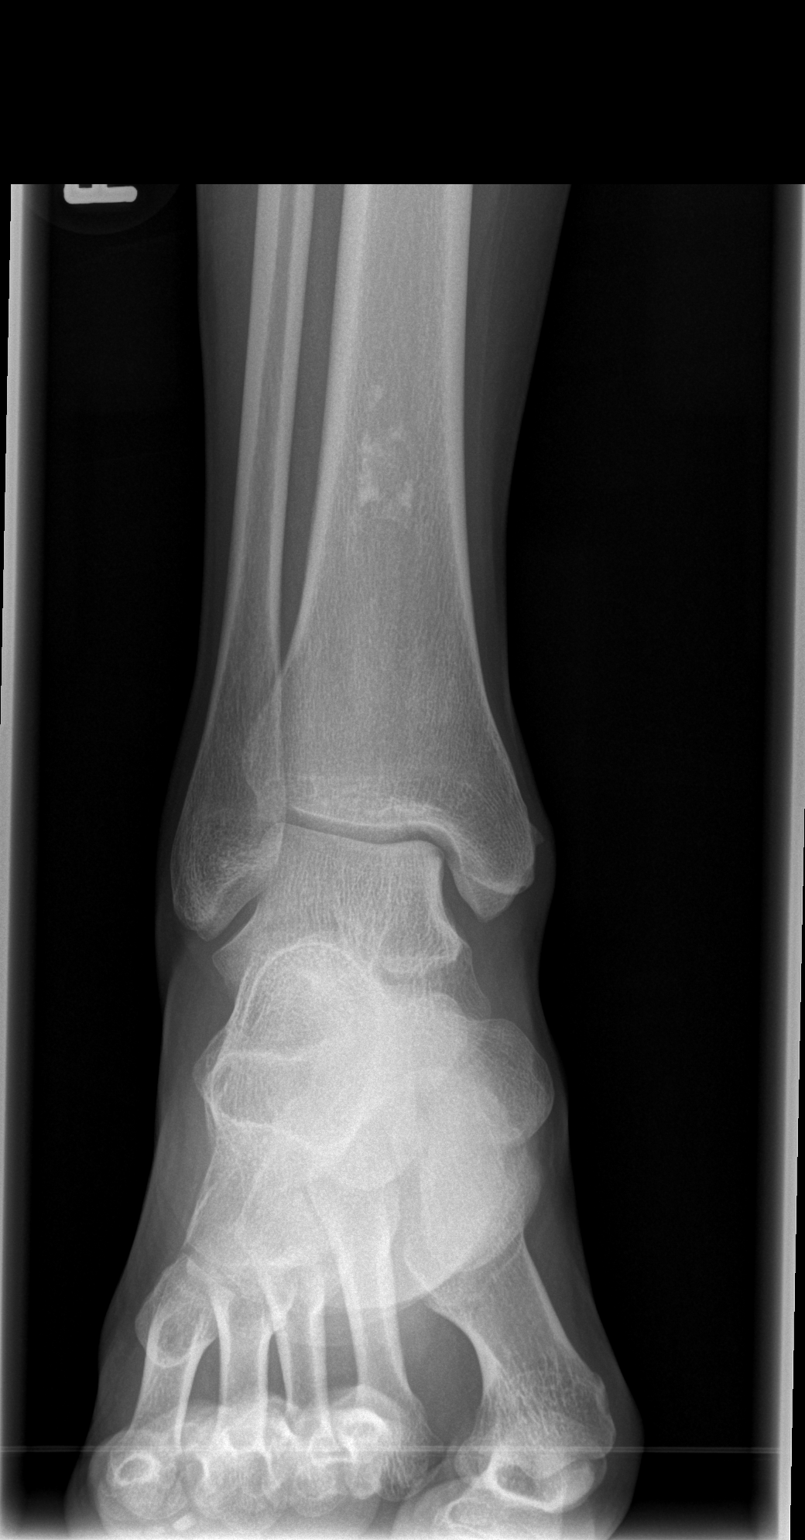

[t ankle joint oblique right]
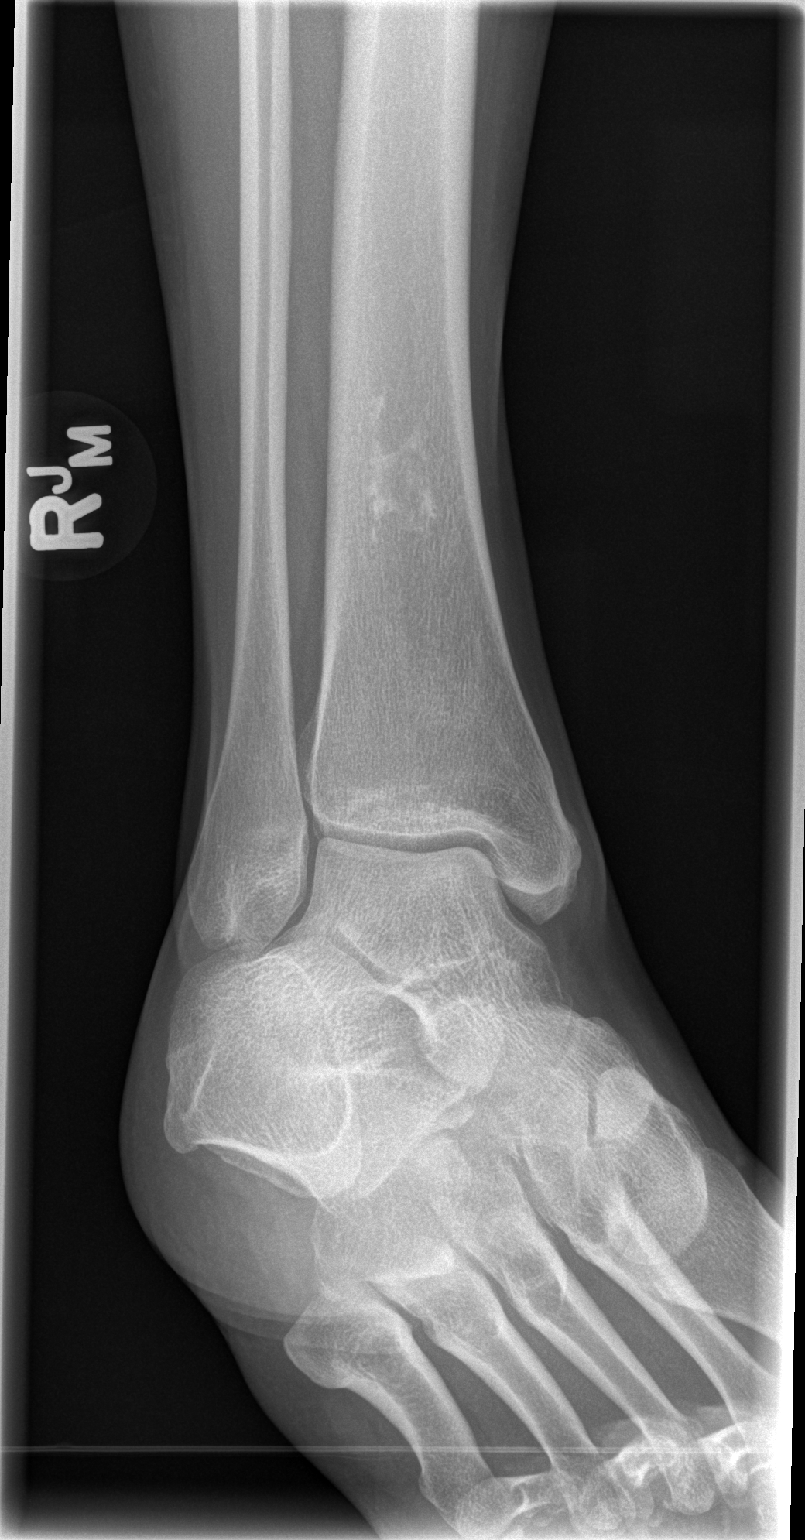

[t ankle joint lat right]
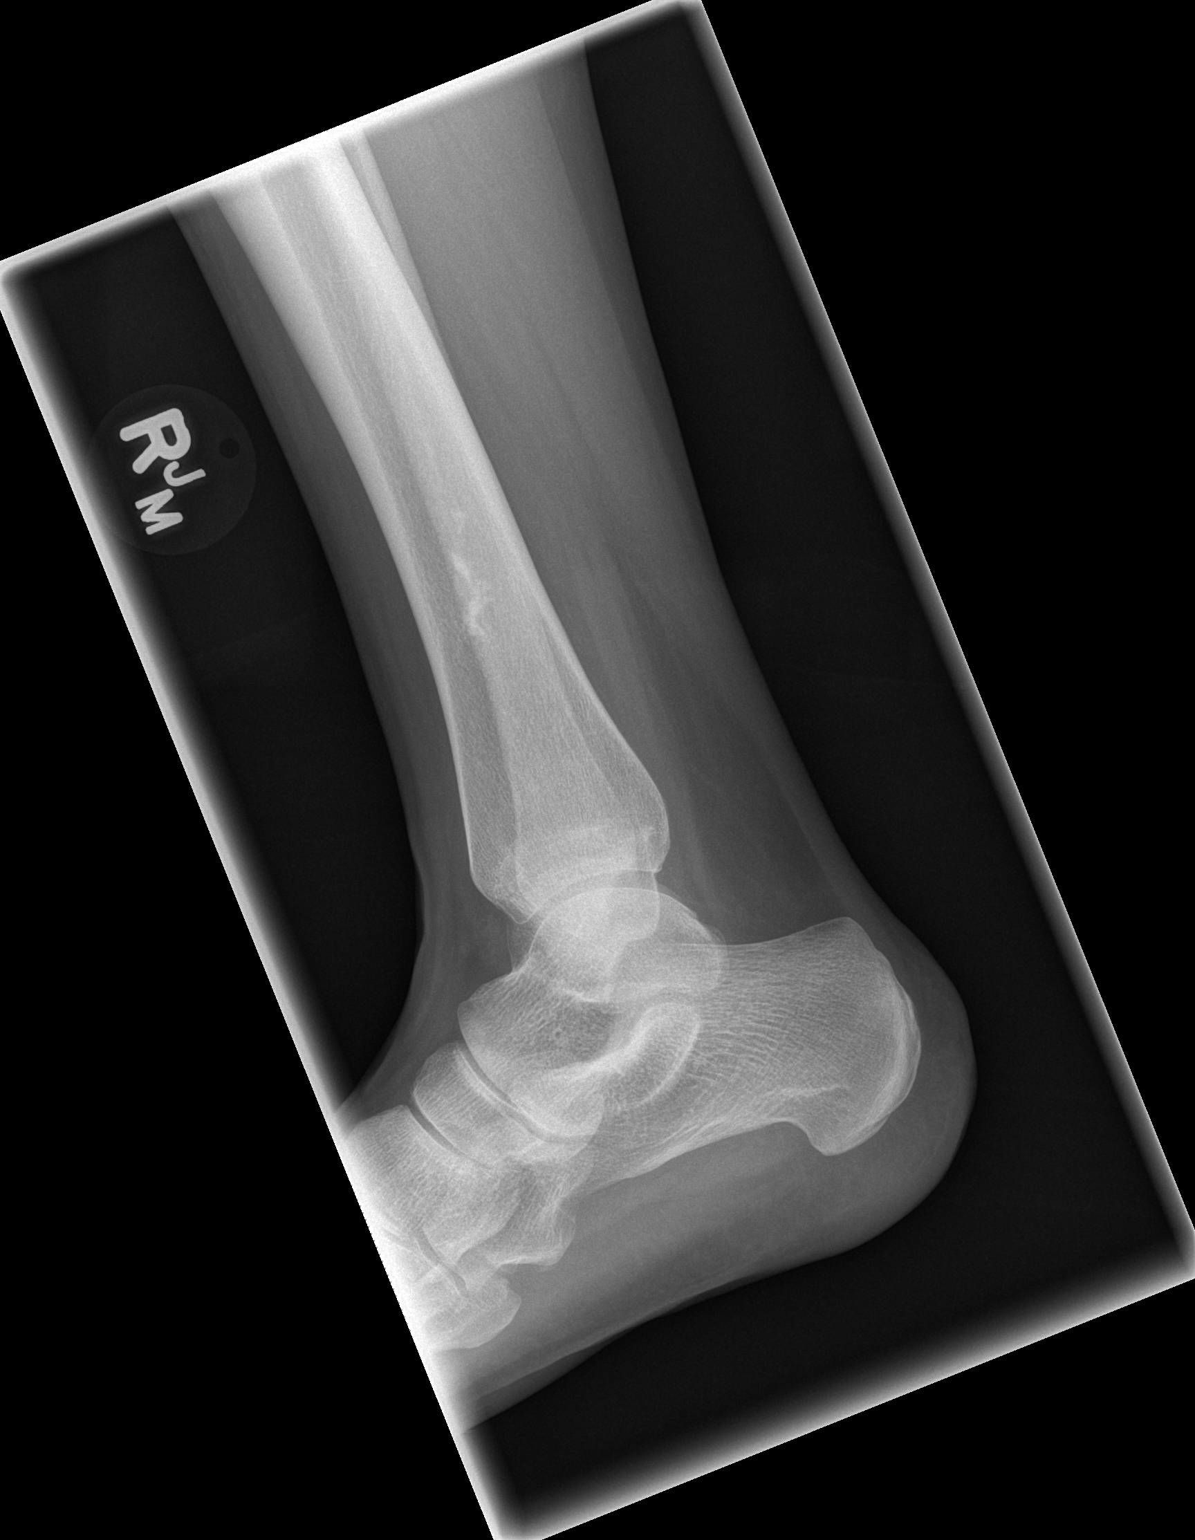

[3 of 3 positions shown; findings below may reference images not displayed]

FINDINGS: No acute bony abnormality. Specifically, no fracture, subluxation,
or dislocation. Soft tissues are intact. Stable area of sclerosis
within the distal right tibia most compatible with bone infarct. No
change.
IMPRESSION: No acute bony abnormality.

## 2017-08-22 IMAGING — DX DG CHEST 2V
2 series · 2 of 2 positions shown · non-contrast
Comparison: Chest radiograph dated 03/17/2014

CLINICAL DATA: 34-year-old female with fever and cough

EXAM:
CHEST  2 VIEW

[chest pa]
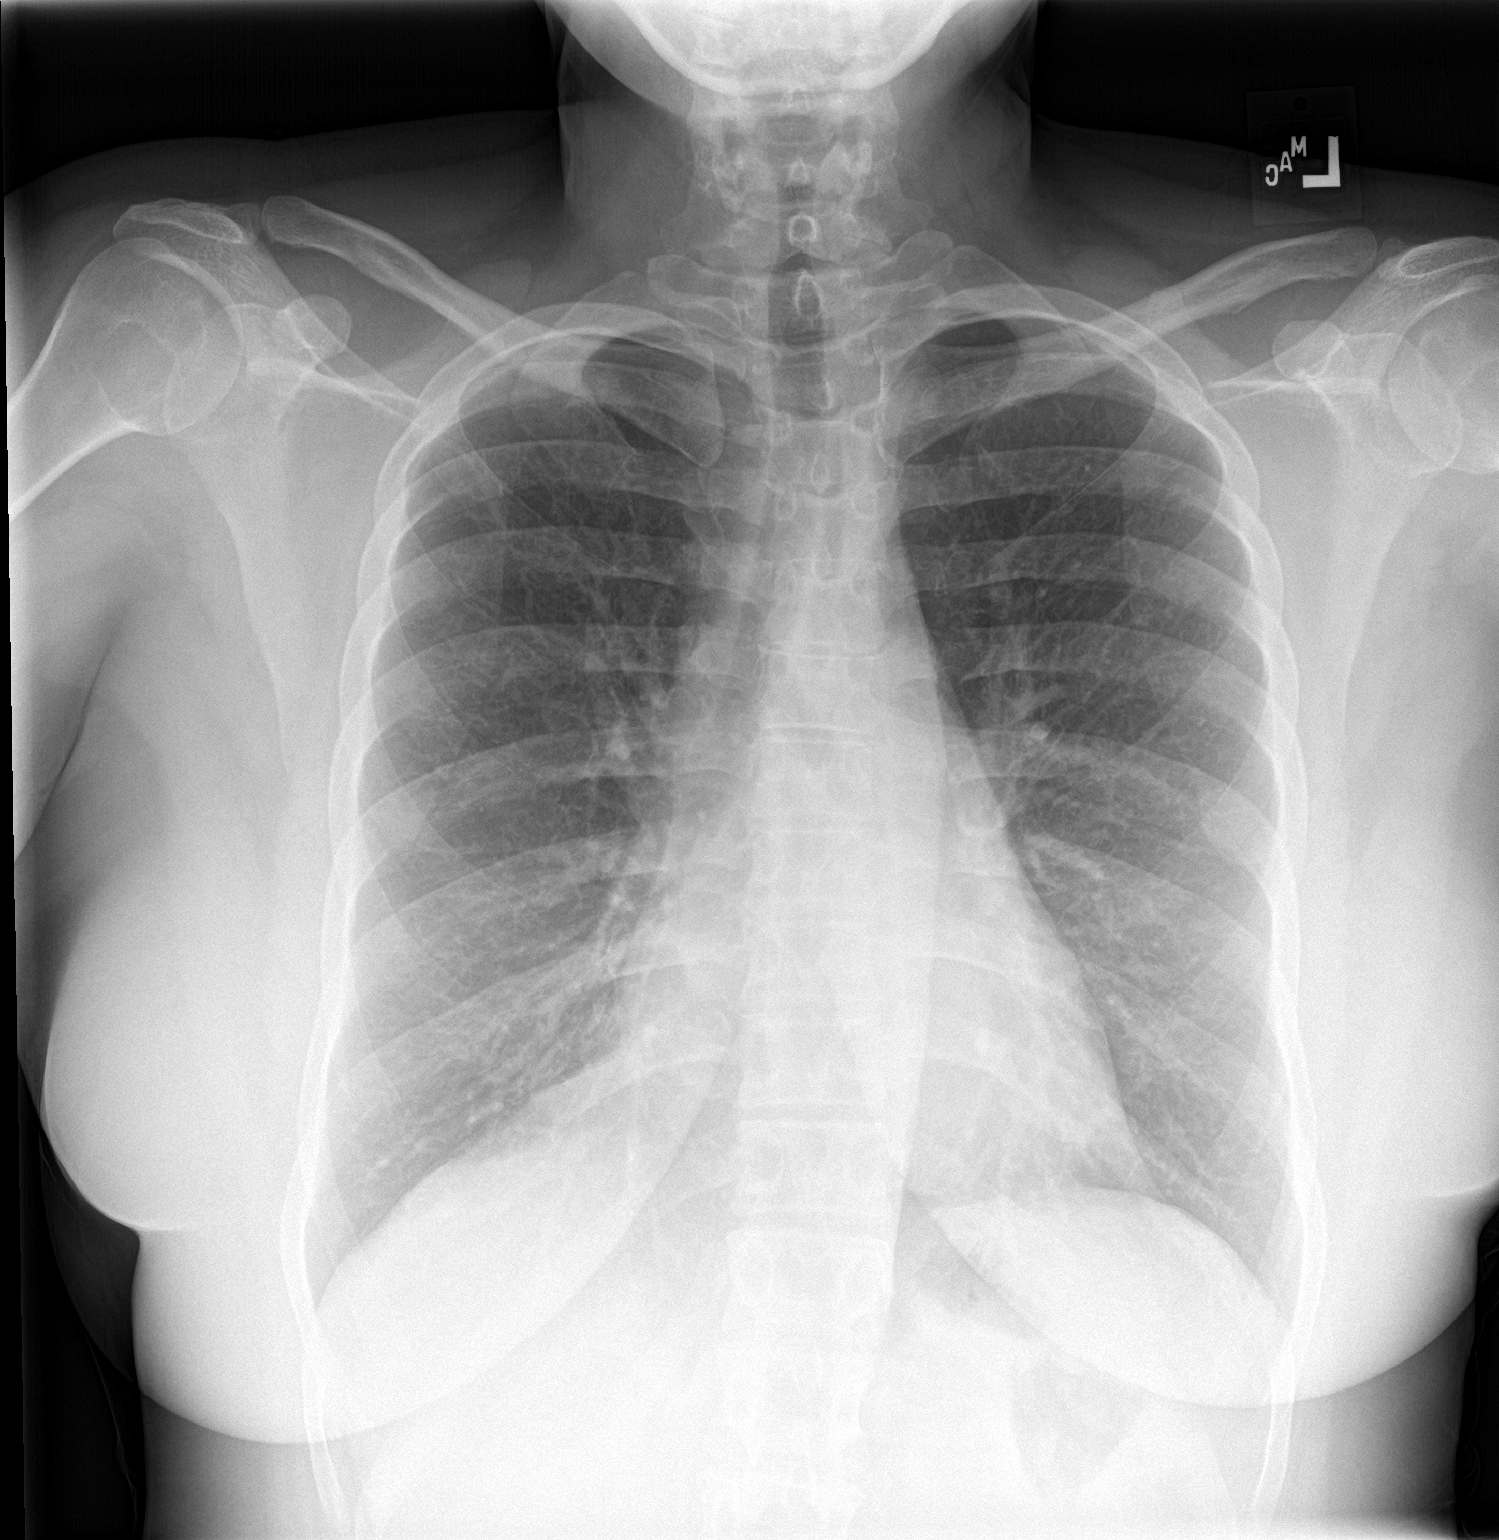

[chest lat]
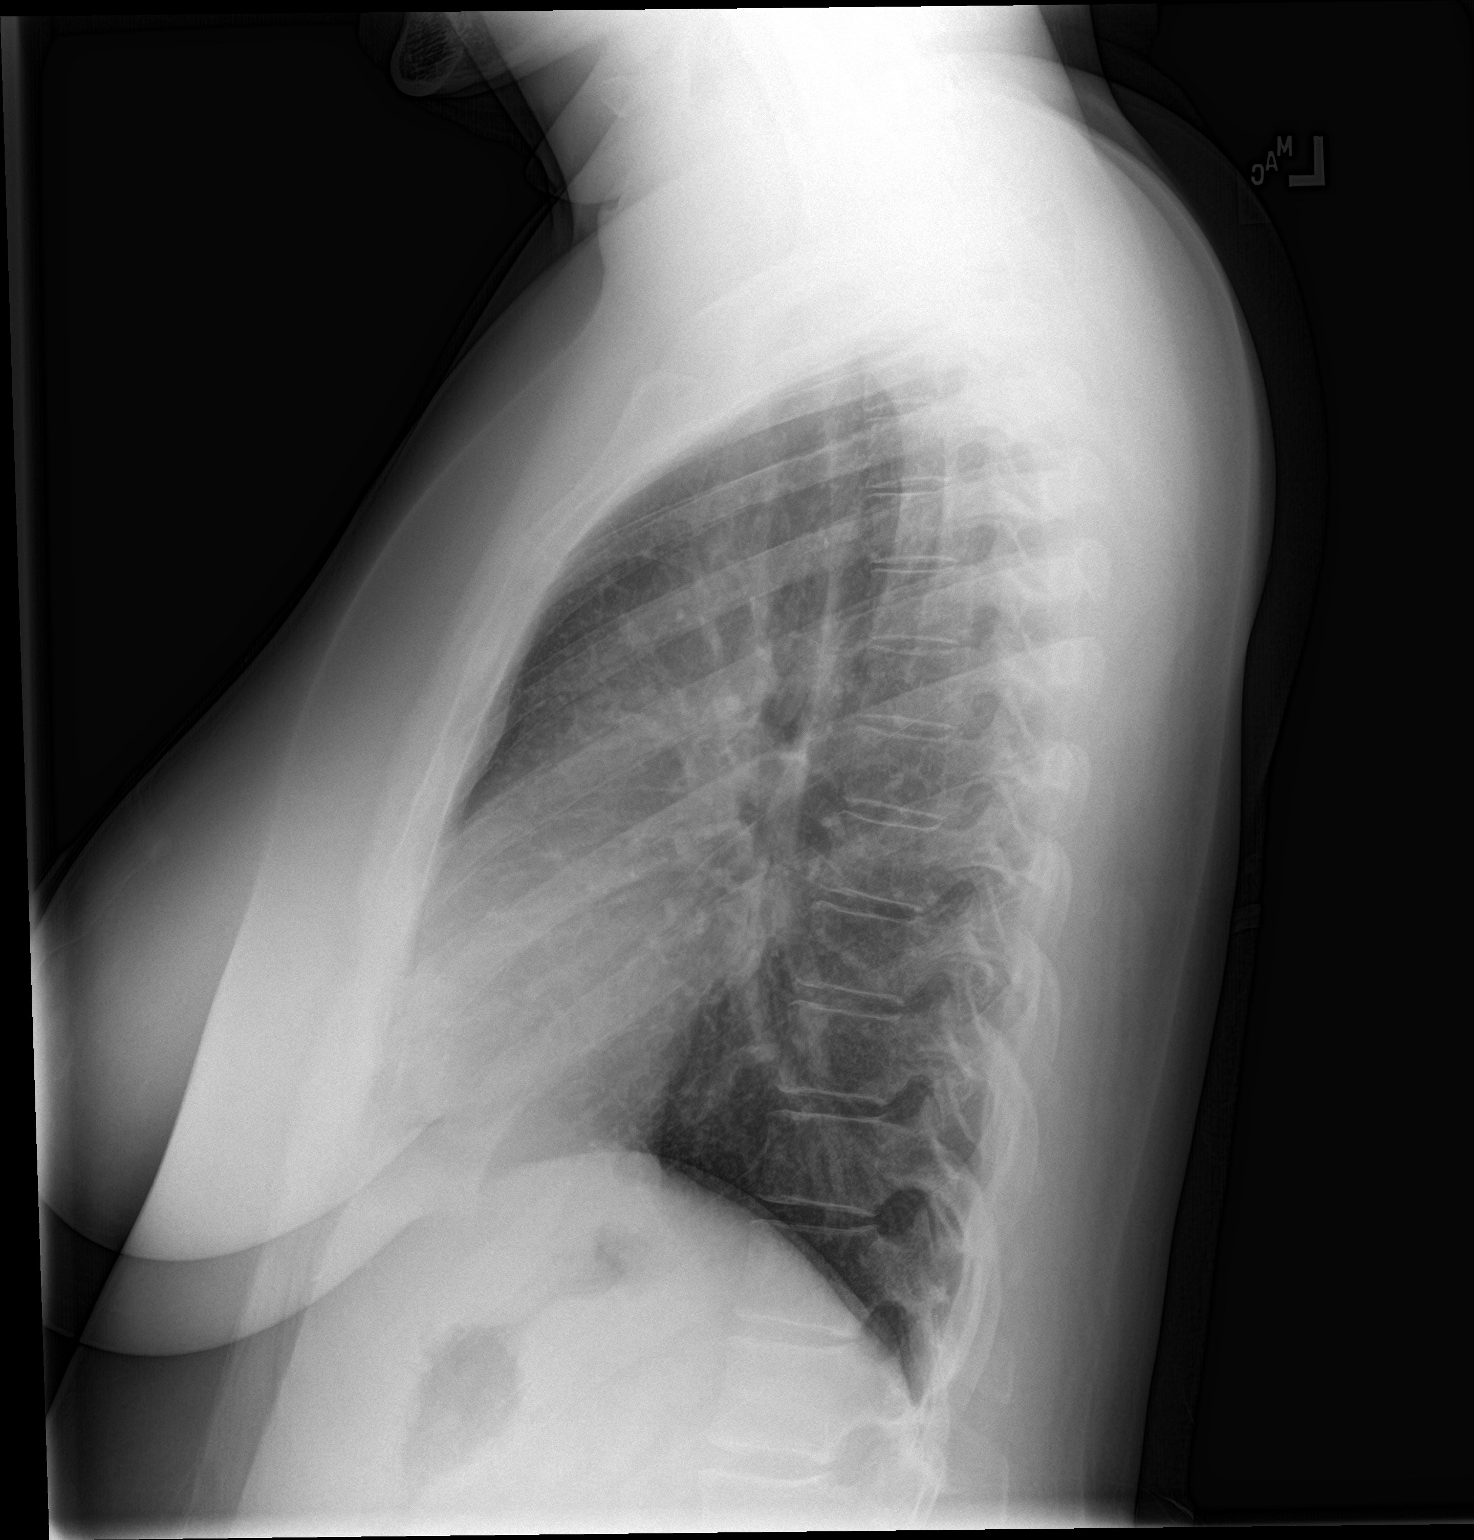

[2 of 2 positions shown; findings below may reference images not displayed]

FINDINGS: The heart size and mediastinal contours are within normal limits.
Both lungs are clear. The visualized skeletal structures are
unremarkable.
IMPRESSION: No active cardiopulmonary disease.

## 2017-08-29 ENCOUNTER — Other Ambulatory Visit: Payer: Self-pay

## 2017-08-29 ENCOUNTER — Emergency Department (HOSPITAL_BASED_OUTPATIENT_CLINIC_OR_DEPARTMENT_OTHER)
Admission: EM | Admit: 2017-08-29 | Discharge: 2017-08-29 | Disposition: A | Discharge status: Home / Self Care | Payer: Medicare Other | Attending: Emergency Medicine | Admitting: Emergency Medicine

## 2017-08-29 ENCOUNTER — Encounter (HOSPITAL_BASED_OUTPATIENT_CLINIC_OR_DEPARTMENT_OTHER): Payer: Self-pay | Admitting: *Deleted

## 2017-08-29 DIAGNOSIS — Z932 Ileostomy status: Secondary | ICD-10-CM | POA: Diagnosis not present

## 2017-08-29 DIAGNOSIS — F17210 Nicotine dependence, cigarettes, uncomplicated: Secondary | ICD-10-CM | POA: Insufficient documentation

## 2017-08-29 DIAGNOSIS — Z79899 Other long term (current) drug therapy: Secondary | ICD-10-CM | POA: Diagnosis not present

## 2017-08-29 DIAGNOSIS — L732 Hidradenitis suppurativa: Secondary | ICD-10-CM | POA: Insufficient documentation

## 2017-08-29 DIAGNOSIS — L02411 Cutaneous abscess of right axilla: Secondary | ICD-10-CM | POA: Insufficient documentation

## 2017-08-29 DIAGNOSIS — L0291 Cutaneous abscess, unspecified: Secondary | ICD-10-CM

## 2017-08-29 MED ORDER — OXYCODONE-ACETAMINOPHEN 5-325 MG PO TABS
1.0000 | ORAL_TABLET | Freq: Once | ORAL | Status: AC
  Administered 2017-08-29: 1 via ORAL
  Filled 2017-08-29: qty 1

## 2017-08-29 MED ORDER — DOXYCYCLINE HYCLATE 100 MG PO CAPS: 100.0000 mg | ORAL_CAPSULE | Freq: Two times a day (BID) | ORAL | 0 refills | Status: DC

## 2017-08-29 MED ORDER — LIDOCAINE-EPINEPHRINE (PF) 2 %-1:200000 IJ SOLN
10.0000 mL | Freq: Once | INTRAMUSCULAR | Status: AC
  Administered 2017-08-29: 10 mL
  Filled 2017-08-29: qty 10

## 2017-08-29 MED ORDER — OXYCODONE-ACETAMINOPHEN 5-325 MG PO TABS: 1.0000 | ORAL_TABLET | Freq: Four times a day (QID) | ORAL | 0 refills | Status: DC | PRN

## 2017-08-29 NOTE — ED Provider Notes (Signed)
[facility] EMERGENCY DEPARTMENT Provider Note   CSN: [id number] Arrival date & time: 08/29/17  1258     History   Chief Complaint Chief Complaint  Patient presents with  . Abscess    HPI Michelle Hill is a 37 y.o. female with a hx of tobacco abuse, Crohn's disease, psoriasis on humira, malignant melanoma, and hidradenitis suppurativa who presents to the ED for abscess to the R axillary region which she noted about 1 week ago. States that the area is progressively worsening. States it is painful. She was previously being prescribed tramadol- states this was DCed due to being infeffective She has not had any drainage from it. Has been applying hot compresses without significant relief. Reports subjective fevers.  States she has had several of these in the past with her hidradenitis.  She called her dermatologist, they were unable to get her an appointment until April 8.  Her dermatologist office directed her to come to the emergency department for I&D, antibiotics, and pain control.  She has an appointment with her dermatologist scheduled now for April 8.   Denies nausea, vomiting, or abdominal pain.  No other areas of abscess.  HPI  Past Medical History:  Diagnosis Date  . Crohn's disease ([place])   . Gastroenteritis   . Psoriasis     Patient Active Problem List   Diagnosis Date Noted  . Anxiety 07/10/2015  . Clinical depression 07/10/2015  . Right ankle pain 07/10/2015  . 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 Malignant melanoma ([place]) 08/08/2013  . Excess weight 08/08/2013  . Crohn's disease of colon ([place]) 08/06/2013  . Neck pain 07/25/2013  . Psoriasis 02/10/2012  . Hidradenitis 08/09/2011  . Left shoulder pain 07/29/2011    Past Surgical History:  Procedure Laterality Date  . ABDOMINAL SURGERY    . ILEOSTOMY    . kidney stent    . NEPHROSTOMY       OB History   None      Home Medications    Prior to Admission medications   Medication Sig Start Date  End Date Taking? Authorizing Provider  acetaminophen (TYLENOL) 325 MG tablet Take 650 mg by mouth every 6 (six) hours as needed.   Yes [provider]  Adalimumab (HUMIRA) 40 MG/0.8ML PSKT Inject into the skin.   Yes [provider]  cholecalciferol (VITAMIN D) 400 UNITS TABS tablet Take 400 Units by mouth.   Yes [provider]  esomeprazole (NEXIUM) 20 MG capsule Take 20 mg by mouth daily at 12 noon.   Yes [provider]  Multiple Vitamin (MULTIVITAMIN) tablet Take 1 tablet by mouth daily.   Yes [provider]  HYDROcodone-acetaminophen (NORCO/VICODIN) 5-325 MG tablet Take 1 tablet by mouth every 4 (four) hours as needed for moderate pain or severe pain. 09/27/16   [name], PA-C  medroxyPROGESTERone (DEPO-PROVERA) 150 MG/ML injection Inject 150 mg into the muscle every 3 (three) months.    [provider]  nystatin cream (MYCOSTATIN) Apply to affected area 2 times daily until resolution 04/16/16   Gloriann Loan, PA-C  traMADol (ULTRAM) 50 MG tablet Take 1 tablet (50 mg total) by mouth every 6 (six) hours as needed. 04/16/16   Gloriann Loan, PA-C  zolpidem (AMBIEN) 5 MG tablet Take 5 mg by mouth at bedtime as needed for sleep.    [provider]  lansoprazole (PREVACID) 30 MG capsule Take 1 capsule (30 mg total) by mouth daily. 12/22/12 03/21/13  Molpus, Jenny Reichmann, MD  Family History Family History  Problem Relation Age of Onset  . Hyperlipidemia Father   . Hypertension Father   . Diabetes Neg Hx   . Heart attack Neg Hx   . Sudden death Neg Hx     Social History Social History   Tobacco Use  . Smoking status: Current Every Day Smoker    Packs/day: 0.50    Types: Cigarettes  . Smokeless tobacco: Never Used  Substance Use Topics  . Alcohol use: No    Alcohol/week: 0.0 oz    Comment: occ  . Drug use: No     Allergies   Chocolate; Aspirin; and Ibuprofen   Review of Systems Review of Systems  Constitutional:  Positive for fever (subjective).  Gastrointestinal: Negative for abdominal pain, nausea and vomiting.  Skin:       Positive for right axillary abscess.     Physical Exam Updated Vital Signs BP 128/78 (BP Location: Right Arm)   Pulse 88   Temp 98.7 F (37.1 C) (Oral)   Resp 18   Ht 5\' 7"  (1.702 m)   Wt 81.6 kg (180 lb)   LMP 08/22/2017   SpO2 100%   BMI 28.19 kg/m   Physical Exam  Constitutional: She appears well-developed and well-nourished. No distress.  HENT:  Head: Normocephalic and atraumatic.  Eyes: Conjunctivae are normal. Right eye exhibits no discharge. Left eye exhibits no discharge.  Cardiovascular: Normal rate and regular rhythm.  No murmur heard. Pulses:      Radial pulses are 2+ on the right side, and 2+ on the left side.  Pulmonary/Chest: Effort normal. No stridor. No respiratory distress. She has no wheezes. She has no rales.  Abdominal:  Ileostomy bag in place.  Neurological: She is alert.  Clear speech.   Skin:  Right axillary region with 2.5 cm diameter area palpable fluctuance with overlying erythema and warmth.  Minimal surrounding induration.  Psychiatric: She has a normal mood and affect. Her behavior is normal. Thought content normal.  Nursing note and vitals reviewed.  ED Treatments / Results  Labs (all labs ordered are listed, but only abnormal results are displayed) Labs Reviewed - No data to display  EKG None  Radiology No results found.  Procedures .Marland KitchenIncision and Drainage Date/Time: 08/29/2017 6:28 PM Performed by: Amaryllis Dyke, PA-C Authorized by: Amaryllis Dyke, PA-C   Consent:    Consent obtained:  Verbal   Consent given by:  Patient   Risks discussed:  Bleeding, incomplete drainage and pain   Alternatives discussed:  No treatment Location:    Type:  Abscess   Size:  2.5   Location:  Upper extremity   Upper extremity location: R axillary region. Pre-procedure details:    Skin preparation:   Betadine Anesthesia (see MAR for exact dosages):    Anesthesia method:  Local infiltration   Local anesthetic:  Lidocaine 2% WITH epi Procedure type:    Complexity:  Simple Procedure details:    Incision types:  Stab incision   Scalpel blade:  11   Wound management:  Probed and deloculated and irrigated with saline   Drainage:  Bloody and purulent   Drainage amount:  Moderate   Packing materials:  1/4 in iodoform gauze Post-procedure details:    Patient tolerance of procedure:  Tolerated well, no immediate complications   (including critical care time)  Medications Ordered in ED Medications  oxyCODONE-acetaminophen (PERCOCET/ROXICET) 5-325 MG per tablet 1 tablet (1 tablet Oral Given 08/29/17 1730)  lidocaine-EPINEPHrine (XYLOCAINE W/EPI)  2 %-1:200000 (PF) injection 10 mL (10 mLs Infiltration Given 08/29/17 1730)     Initial Impression / Assessment and Plan / ED Course  I have reviewed the triage vital signs and the nursing notes.  Pertinent labs & imaging results that were available during my care of the patient were reviewed by me and considered in my medical decision making (see chart for details).   Patient with hx of hidradenitis surppurativa who presents with abscess amenable to I&D procedure note above.  Patient with history of similar requiring doxycycline and pain control.  Will place patient on doxycycline.  Will prescribe a few tablets of Percocet for pain control- [place] Controlled Substance reporting System queried-patient with prescription for tramadol, patient states that she has not taking these medications and they have been discontinued by her provider.  Will have patient follow-up in 2 days for wound recheck and packing removal.  Discussed reasons to return sooner. I discussed treatment plan, need for wound recheck and subsequent dermatology follow up as scheduled, and return precautions with the patient. Provided opportunity for questions, patient confirmed  understanding and is in agreement with plan.   Final Clinical Impressions(s) / ED Diagnoses   Final diagnoses:  Abscess  Hidradenitis suppurativa    ED Discharge Orders        Ordered    doxycycline (VIBRAMYCIN) 100 MG capsule  2 times daily     08/29/17 1846    oxyCODONE-acetaminophen (PERCOCET/ROXICET) 5-325 MG tablet  Every 6 hours PRN     08/29/17 1846       Petrucelli, Glynda Jaeger, PA-C 08/29/17 2017    Tanna Furry, MD 08/30/17 [phone]

## 2017-08-29 NOTE — ED Triage Notes (Signed)
Abscess right axilla.

## 2017-08-29 NOTE — Discharge Instructions (Addendum)
You were seen in the emergency department today for an abscess that was incised and drained.  A packing was placed.  He will need to follow-up in 2 days for a recheck of the wound and for packing removal.  You may do this in the emergency department, go to urgent care, or see your primary care doctor.  We have given you prescription for doxycycline, this is an antibiotic, take this once in the morning and once in the evening. Please take all of your antibiotics until finished. You may develop abdominal discomfort or diarrhea from the antibiotic.  You may help offset this with probiotics which you can buy at the store (ask your pharmacist if unable to find) or get probiotics in the form of eating yogurt. Do not eat or take the probiotics until 2 hours after your antibiotic. If you are unable to tolerate these side effects follow-up with your primary care provider or return to the emergency department.   If you begin to experience any blistering, rashes, swelling, or difficulty breathing seek medical care for evaluation of potentially more serious side effects.   Please be aware that this medication may interact with other medications you are taking, please be sure to discuss your medication list with your pharmacist.  We have also given you a prescription for Percocet.  He may take this every 6 hours as needed for pain.  Please only take this for severe pain.  Do not drive or operate heavy machinery when taking this medication.  Return to the emergency department sooner for any new or worsening symptoms or any other concerns you may have.  Be sure to follow-up with your dermatologist on April 8 as scheduled.

## 2021-03-09 ENCOUNTER — Encounter (HOSPITAL_BASED_OUTPATIENT_CLINIC_OR_DEPARTMENT_OTHER): Payer: Self-pay | Admitting: Emergency Medicine

## 2021-03-09 ENCOUNTER — Other Ambulatory Visit: Payer: Self-pay

## 2021-03-09 DIAGNOSIS — F17210 Nicotine dependence, cigarettes, uncomplicated: Secondary | ICD-10-CM | POA: Insufficient documentation

## 2021-03-09 DIAGNOSIS — Y92410 Unspecified street and highway as the place of occurrence of the external cause: Secondary | ICD-10-CM | POA: Diagnosis not present

## 2021-03-09 DIAGNOSIS — S39012A Strain of muscle, fascia and tendon of lower back, initial encounter: Secondary | ICD-10-CM | POA: Insufficient documentation

## 2021-03-09 DIAGNOSIS — M79604 Pain in right leg: Secondary | ICD-10-CM | POA: Insufficient documentation

## 2021-03-09 DIAGNOSIS — M79605 Pain in left leg: Secondary | ICD-10-CM | POA: Diagnosis not present

## 2021-03-09 DIAGNOSIS — S3992XA Unspecified injury of lower back, initial encounter: Secondary | ICD-10-CM | POA: Diagnosis present
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
## 2021-03-10 ENCOUNTER — Emergency Department (HOSPITAL_BASED_OUTPATIENT_CLINIC_OR_DEPARTMENT_OTHER)
Admission: EM | Admit: 2021-03-10 | Discharge: 2021-03-10 | Disposition: A | Discharge status: Home / Self Care | Payer: Medicare Other | Attending: Emergency Medicine | Admitting: Emergency Medicine

## 2021-03-10 ENCOUNTER — Emergency Department (HOSPITAL_BASED_OUTPATIENT_CLINIC_OR_DEPARTMENT_OTHER): Payer: Medicare Other

## 2021-03-10 DIAGNOSIS — S39012A Strain of muscle, fascia and tendon of lower back, initial encounter: Secondary | ICD-10-CM | POA: Diagnosis not present

## 2021-03-10 LAB — PREGNANCY, URINE: Preg Test, Ur: NEGATIVE

## 2021-03-10 MED ORDER — METHOCARBAMOL 500 MG PO TABS: 500.0000 mg | ORAL_TABLET | Freq: Three times a day (TID) | ORAL | 0 refills | Status: DC | PRN

## 2021-03-10 NOTE — Discharge Instructions (Addendum)
Take ibuprofen 600 mg every 6 hours as needed for pain.  Begin taking Robaxin as prescribed as needed for pain not relieved with ibuprofen.  Follow-up with primary doctor if symptoms are not improving in the next week.
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
## 2021-04-18 ENCOUNTER — Encounter (HOSPITAL_BASED_OUTPATIENT_CLINIC_OR_DEPARTMENT_OTHER): Payer: Self-pay

## 2021-04-18 ENCOUNTER — Other Ambulatory Visit: Payer: Self-pay

## 2021-04-18 ENCOUNTER — Emergency Department (HOSPITAL_BASED_OUTPATIENT_CLINIC_OR_DEPARTMENT_OTHER): Payer: Medicare Other

## 2021-04-18 ENCOUNTER — Emergency Department (HOSPITAL_BASED_OUTPATIENT_CLINIC_OR_DEPARTMENT_OTHER)
Admission: EM | Admit: 2021-04-18 | Discharge: 2021-04-18 | Disposition: A | Discharge status: Home / Self Care | Payer: Medicare Other | Attending: Student | Admitting: Student

## 2021-04-18 DIAGNOSIS — M25562 Pain in left knee: Secondary | ICD-10-CM | POA: Diagnosis not present

## 2021-04-18 DIAGNOSIS — F17210 Nicotine dependence, cigarettes, uncomplicated: Secondary | ICD-10-CM | POA: Diagnosis not present

## 2021-04-18 DIAGNOSIS — S8992XA Unspecified injury of left lower leg, initial encounter: Secondary | ICD-10-CM | POA: Diagnosis present

## 2021-04-18 DIAGNOSIS — S72435A Nondisplaced fracture of medial condyle of left femur, initial encounter for closed fracture: Secondary | ICD-10-CM | POA: Diagnosis not present

## 2021-04-18 DIAGNOSIS — X501XXA Overexertion from prolonged static or awkward postures, initial encounter: Secondary | ICD-10-CM | POA: Diagnosis not present

## 2021-04-18 DIAGNOSIS — S72402A Unspecified fracture of lower end of left femur, initial encounter for closed fracture: Secondary | ICD-10-CM

## 2021-04-18 MED ORDER — OXYCODONE HCL 5 MG PO TABS: 5.0000 mg | ORAL_TABLET | Freq: Four times a day (QID) | ORAL | 0 refills | Status: DC | PRN

## 2021-04-18 MED ORDER — OXYCODONE-ACETAMINOPHEN 5-325 MG PO TABS
1.0000 | ORAL_TABLET | Freq: Once | ORAL | Status: AC
  Administered 2021-04-18: 1 via ORAL
  Filled 2021-04-18: qty 1

## 2021-04-18 NOTE — ED Notes (Signed)
Xray finished at [facility] 

## 2021-04-18 NOTE — ED Notes (Signed)
Alert, NAD, calm, interactive. C/o L medial knee pain, stiffness and weakness like it wants to buckle. Pain shifts laterally when lying R side down. Skin intact. Denies hip or ankle issues. No known injury. Has not been seen for this before. Taking prescribed muscle relaxer for other issues.

## 2021-04-18 NOTE — ED Provider Notes (Signed)
[facility] Provider Note   CSN: [id number] Arrival date & time: 04/18/21  8768     History Chief Complaint  Patient presents with   Knee Pain    Michelle Hill is a 39 y.o. female with PMH Crohn's disease status post ileostomy, psoriasis who presents the emergency department for evaluation of left knee pain.  Patient states that 5 days ago she bent down to pick something up off of the bed and felt immediate pain on the medial aspect of the left knee.  She has since had worsening pain not improved by Tylenol, ibuprofen or topical IcyHot.  She has been elevating the leg without success.  Denies numbness, tingling, weakness of lower extremity.  Denies additional trauma to the leg.   Knee Pain Associated symptoms: no back pain and no fever       Past Medical History:  Diagnosis Date   Crohn's disease ([place])    Gastroenteritis    Psoriasis     Patient Active Problem List   Diagnosis Date Noted   Anxiety 07/10/2015   Clinical depression 07/10/2015   Right ankle pain 07/10/2015   Injury of ureter 01/02/2014   Malignant melanoma ([place]) 08/08/2013   Excess weight 08/08/2013   Crohn's disease of colon ([place]) 08/06/2013   Neck pain 07/25/2013   Psoriasis 02/10/2012   Hidradenitis 08/09/2011   Left shoulder pain 07/29/2011    Past Surgical History:  Procedure Laterality Date   ABDOMINAL SURGERY     ILEOSTOMY     kidney stent     NEPHROSTOMY       OB History   No obstetric history on file.     Family History  Problem Relation Age of Onset   Hyperlipidemia Father    Hypertension Father    Diabetes Neg Hx    Heart attack Neg Hx    Sudden death Neg Hx     Social History   Tobacco Use   Smoking status: Every Day    Packs/day: 0.50    Types: Cigarettes   Smokeless tobacco: Never  Substance Use Topics   Alcohol use: No    Alcohol/week: 0.0 standard drinks    Comment: occ   Drug use: No    Home Medications Prior to  Admission medications   Medication Sig Start Date End Date Taking? Authorizing Provider  oxyCODONE (ROXICODONE) 5 MG immediate release tablet Take 1 tablet (5 mg total) by mouth every 6 (six) hours as needed for severe pain or breakthrough pain. 04/18/21  Yes Racquel Arkin, MD  acetaminophen (TYLENOL) 325 MG tablet Take 650 mg by mouth every 6 (six) hours as needed.    [provider]  Adalimumab (HUMIRA) 40 MG/0.8ML PSKT Inject into the skin.    [provider]  ALPRAZolam Duanne Moron) 0.5 MG tablet Take by mouth.    [provider]  atorvastatin (LIPITOR) 20 MG tablet Take 20 mg by mouth at bedtime. 02/13/21   [provider]  cholecalciferol (VITAMIN D) 400 UNITS TABS tablet Take 400 Units by mouth.    [provider]  clonazePAM (KLONOPIN) 0.5 MG tablet Take 0.25-0.5 mg by mouth daily as needed. 02/26/21   [provider]  dicyclomine (BENTYL) 10 MG capsule Take 10 mg by mouth every 6 (six) hours as needed. 02/11/21   [provider]  doxycycline (VIBRAMYCIN) 100 MG capsule Take 1 capsule (100 mg total) by mouth 2 (two) times daily. 08/29/17   Petrucelli, Glynda Jaeger, PA-C  esomeprazole (NEXIUM) 20 MG capsule Take 20 mg by mouth daily at 12 noon.    [provider]  gabapentin (NEURONTIN) 400 MG capsule Take 400 mg by mouth 3 (three) times daily. 01/26/21   [provider]  guaifenesin (ROBITUSSIN) 100 MG/5ML syrup Take by mouth. 03/04/16   [provider]  Guselkumab 100 MG/ML SOPN Inject into the skin. 11/15/17   [provider]  HYDROcodone-acetaminophen (NORCO/VICODIN) 5-325 MG tablet Take 1 tablet by mouth every 4 (four) hours as needed for moderate pain or severe pain. 09/27/16   [name], PA-C  medroxyPROGESTERone (DEPO-PROVERA) 150 MG/ML injection Inject 150 mg into the muscle every 3 (three) months.    [provider]  methocarbamol (ROBAXIN) 500 MG tablet Take 1 tablet (500 mg total) by  mouth every 8 (eight) hours as needed for muscle spasms. 03/10/21   Veryl Speak, MD  Multiple Vitamin (MULTIVITAMIN) tablet Take 1 tablet by mouth daily.    [provider]  nystatin cream (MYCOSTATIN) Apply to affected area 2 times daily until resolution 04/16/16   Gloriann Loan, PA-C  omeprazole (PRILOSEC) 40 MG capsule Take 40 mg by mouth 2 (two) times daily. 02/11/21   [provider]  oxyCODONE-acetaminophen (PERCOCET/ROXICET) 5-325 MG tablet Take 1 tablet by mouth every 6 (six) hours as needed for severe pain. 08/29/17   Petrucelli, Samantha R, PA-C  traMADol (ULTRAM) 50 MG tablet Take 1 tablet (50 mg total) by mouth every 6 (six) hours as needed. 04/16/16   Gloriann Loan, PA-C  zolpidem (AMBIEN) 5 MG tablet Take 5 mg by mouth at bedtime as needed for sleep.    [provider]    Allergies    Chocolate, Aspirin, Ibuprofen, and Tramadol  Review of Systems   Review of Systems  Constitutional:  Negative for chills and fever.  HENT:  Negative for ear pain and sore throat.   Eyes:  Negative for pain and visual disturbance.  Respiratory:  Negative for cough and shortness of breath.   Cardiovascular:  Negative for chest pain and palpitations.  Gastrointestinal:  Negative for abdominal pain and vomiting.  Genitourinary:  Negative for dysuria and hematuria.  Musculoskeletal:  Positive for arthralgias. Negative for back pain.  Skin:  Negative for color change and rash.  Neurological:  Negative for seizures and syncope.  All other systems reviewed and are negative.  Physical Exam Updated Vital Signs BP 124/82 (BP Location: Right Arm)   Pulse 79   Temp 98.1 F (36.7 C) (Oral)   Resp 18   Ht 5\' 7"  (1.702 m)   Wt 95.3 kg   SpO2 98%   BMI 32.89 kg/m   Physical Exam Vitals and nursing note reviewed.  Constitutional:      General: She is not in acute distress.    Appearance: She is well-developed.  HENT:     Head: Normocephalic and atraumatic.  Eyes:      Conjunctiva/sclera: Conjunctivae normal.  Cardiovascular:     Rate and Rhythm: Normal rate and regular rhythm.     Heart sounds: No murmur heard. Pulmonary:     Effort: Pulmonary effort is normal. No respiratory distress.     Breath sounds: Normal breath sounds.  Abdominal:     Palpations: Abdomen is soft.     Tenderness: There is no abdominal tenderness.  Musculoskeletal:        General: Swelling and tenderness (Left medial knee) present.     Cervical back: Neck supple.  Skin:  General: Skin is warm and dry.  Neurological:     Mental Status: She is alert.    ED Results / Procedures / Treatments   Labs (all labs ordered are listed, but only abnormal results are displayed) Labs Reviewed - No data to display  EKG None  Radiology DG Knee Complete 4 Views Left  Result Date: 04/18/2021 CLINICAL DATA:  Rule out fracture, left knee pain EXAM: LEFT KNEE - COMPLETE 4+ VIEW COMPARISON:  None. FINDINGS: There is evidence of an osteochondral defect or subchondral insufficiency fracture of the medial femoral condyle, which measures 1.8 cm in width. There is extensive adjacent sclerosis. Similar findings to a lesser degree in the lateral femoral condyle. 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 There is a small joint effusion. Mild medial joint space narrowing which is likely due to degenerative arthritis. IMPRESSION: Large osteochondral defect versus subchondral insufficiency fracture of the medial femoral condyle, with similar findings to a lesser degree in the lateral femoral condyle. Small joint effusion. 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 Electronically Signed   By: Maurine Simmering M.D.   On: 04/18/2021 09:37    Procedures Procedures   Medications Ordered in ED Medications  oxyCODONE-acetaminophen (PERCOCET/ROXICET) 5-325 MG per tablet 1 tablet (1  tablet Oral Given 04/18/21 1009)    ED Course  I have reviewed the triage vital signs and the nursing notes.  Pertinent labs & imaging results that were available during my care of the patient were reviewed by me and considered in my medical decision making (see chart for details).    MDM Rules/Calculators/A&P                           Patient seen emergency department for evaluation of left knee pain.  Physical exam reveals tenderness over the medial aspect of the left knee with decreased range of motion secondary to pain.  No overlying cellulitis or significant joint effusion.  X-ray imaging showing a large osteochondral defect versus subchondral insufficiency fracture of the medial femoral condyle greater than the lateral femoral condyle.  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  I spoke with orthopedics who agrees the patient can be seen in the outpatient setting for this insufficiency fracture.  Patient placed in knee immobilizer and given crutches for ambulation.  Short prescription for oxycodone was provided for breakthrough pain only.  Patient will follow up with orthopedics in the outpatient setting.  On reevaluation, no evidence of compartment syndrome, pulses intact.  Patient then discharged discharge. Final Clinical Impression(s) / ED Diagnoses Final diagnoses:  Closed fracture of distal end of left femur, unspecified fracture morphology, initial encounter ([place])    Rx / DC Orders ED Discharge Orders          Ordered    oxyCODONE (ROXICODONE) 5 MG immediate release tablet  Every 6 hours PRN        04/18/21 1029             Girlie Veltri, Debe Coder, MD 04/18/21 1117

## 2021-04-18 NOTE — ED Triage Notes (Signed)
Pt states left knee pain beginning Tuesday, no injury recalled but did find area painful after kneeling down on it earlier in the week. Pt ambulating with pain

## 2021-04-27 ENCOUNTER — Emergency Department (HOSPITAL_BASED_OUTPATIENT_CLINIC_OR_DEPARTMENT_OTHER): Payer: Medicare Other

## 2021-04-27 ENCOUNTER — Other Ambulatory Visit (HOSPITAL_BASED_OUTPATIENT_CLINIC_OR_DEPARTMENT_OTHER): Payer: Self-pay

## 2021-04-27 ENCOUNTER — Emergency Department (HOSPITAL_BASED_OUTPATIENT_CLINIC_OR_DEPARTMENT_OTHER)
Admission: EM | Admit: 2021-04-27 | Discharge: 2021-04-27 | Disposition: A | Discharge status: Home / Self Care | Payer: Medicare Other | Attending: Emergency Medicine | Admitting: Emergency Medicine

## 2021-04-27 ENCOUNTER — Other Ambulatory Visit: Payer: Self-pay

## 2021-04-27 ENCOUNTER — Ambulatory Visit: Payer: Medicare Other | Attending: Internal Medicine

## 2021-04-27 ENCOUNTER — Encounter (HOSPITAL_BASED_OUTPATIENT_CLINIC_OR_DEPARTMENT_OTHER): Payer: Self-pay

## 2021-04-27 DIAGNOSIS — Y9339 Activity, other involving climbing, rappelling and jumping off: Secondary | ICD-10-CM | POA: Insufficient documentation

## 2021-04-27 DIAGNOSIS — M79645 Pain in left finger(s): Secondary | ICD-10-CM | POA: Diagnosis not present

## 2021-04-27 DIAGNOSIS — F17210 Nicotine dependence, cigarettes, uncomplicated: Secondary | ICD-10-CM | POA: Diagnosis not present

## 2021-04-27 DIAGNOSIS — Z85820 Personal history of malignant melanoma of skin: Secondary | ICD-10-CM | POA: Diagnosis not present

## 2021-04-27 DIAGNOSIS — W0110XA Fall on same level from slipping, tripping and stumbling with subsequent striking against unspecified object, initial encounter: Secondary | ICD-10-CM | POA: Insufficient documentation

## 2021-04-27 DIAGNOSIS — Z23 Encounter for immunization: Secondary | ICD-10-CM

## 2021-04-27 DIAGNOSIS — S6992XA Unspecified injury of left wrist, hand and finger(s), initial encounter: Secondary | ICD-10-CM | POA: Diagnosis not present

## 2021-04-27 DIAGNOSIS — Z79899 Other long term (current) drug therapy: Secondary | ICD-10-CM | POA: Insufficient documentation

## 2021-04-27 MED ORDER — OXYCODONE-ACETAMINOPHEN 5-325 MG PO TABS
1.0000 | ORAL_TABLET | Freq: Once | ORAL | Status: AC
  Administered 2021-04-27: 1 via ORAL
  Filled 2021-04-27: qty 1

## 2021-04-27 MED ORDER — OXYCODONE-ACETAMINOPHEN 5-325 MG PO TABS
1.0000 | ORAL_TABLET | Freq: Three times a day (TID) | ORAL | 0 refills | Status: DC | PRN
  Filled 2021-04-27: qty 10, 4d supply, fill #0

## 2021-04-27 NOTE — Progress Notes (Signed)
   Covid-19 Vaccination Clinic  Name:  Michelle Hill    MRN: [id number] DOB: 06-24-1980  04/27/2021  [name] was observed post Covid-19 immunization for 15 minutes without incident. She was provided with Vaccine Information Sheet and instruction to access the V-Safe system.   [name] was instructed to call 911 with any severe reactions post vaccine: Difficulty breathing  Swelling of face and throat  A fast heartbeat  A bad rash all over body  Dizziness and weakness   Immunizations Administered     Name Date Dose VIS Date Route   Pfizer Covid-19 Vaccine Bivalent Booster 04/27/2021  9:25 AM 0.3 mL 02/04/2021 Intramuscular   Manufacturer: [place]   Lot: [id number]   [place]: [phone]

## 2021-04-27 NOTE — Discharge Instructions (Addendum)
I do not see signs of a broken bone on your finger x-rays.  It is possible that you have injured the tendon in your finger.  I would advise that you call today to set up follow-up appointment with a hand surgeon in the office in 1 week.

## 2021-04-27 NOTE — ED Notes (Signed)
Patient transported to X-ray 

## 2021-04-27 NOTE — ED Triage Notes (Signed)
Pt reports she jumped up while playing a game of uno and fell. Injured left middle finger. Noted to redness to knuckle area and decreased movement

## 2021-04-27 NOTE — ED Provider Notes (Signed)
[facility] Provider Note   CSN: [id number] Arrival date & time: 04/27/21  0736     History Chief Complaint  Patient presents with   Hand Injury    Michelle Hill is a 40 y.o. female presented emergency department with an injury to her left hand.  The patient is right-handed.  She reports that she fell yesterday evening around midnight and stubbed her left third finger against something.  She has had significant pain and swelling near her left 3rd MCP since then.  She has difficulty completely straightening that middle finger.  Her other fingers and her wrist are unaffected.  No other injuries reported.  HPI     Past Medical History:  Diagnosis Date   Crohn's disease ([place])    Gastroenteritis    Psoriasis     Patient Active Problem List   Diagnosis Date Noted   Anxiety 07/10/2015   Clinical depression 07/10/2015   Right ankle pain 07/10/2015   Injury of ureter 01/02/2014   Malignant melanoma ([place]) 08/08/2013   Excess weight 08/08/2013   Crohn's disease of colon ([place]) 08/06/2013   Neck pain 07/25/2013   Psoriasis 02/10/2012   Hidradenitis 08/09/2011   Left shoulder pain 07/29/2011    Past Surgical History:  Procedure Laterality Date   ABDOMINAL SURGERY     ILEOSTOMY     kidney stent     NEPHROSTOMY       OB History   No obstetric history on file.     Family History  Problem Relation Age of Onset   Hyperlipidemia Father    Hypertension Father    Diabetes Neg Hx    Heart attack Neg Hx    Sudden death Neg Hx     Social History   Tobacco Use   Smoking status: Every Day    Packs/day: 0.50    Types: Cigarettes   Smokeless tobacco: Never  Substance Use Topics   Alcohol use: No    Alcohol/week: 0.0 standard drinks    Comment: occ   Drug use: No    Home Medications Prior to Admission medications   Medication Sig Start Date End Date Taking? Authorizing Provider  oxyCODONE-acetaminophen (PERCOCET/ROXICET) 5-325 MG  tablet Take 1 tablet by mouth every 8 (eight) hours as needed for severe pain. 04/27/21  Yes Wyvonnia Dusky, MD  acetaminophen (TYLENOL) 325 MG tablet Take 650 mg by mouth every 6 (six) hours as needed.    [provider]  Adalimumab (HUMIRA) 40 MG/0.8ML PSKT Inject into the skin.    [provider]  ALPRAZolam Duanne Moron) 0.5 MG tablet Take by mouth.    [provider]  atorvastatin (LIPITOR) 20 MG tablet Take 20 mg by mouth at bedtime. 02/13/21   [provider]  cholecalciferol (VITAMIN D) 400 UNITS TABS tablet Take 400 Units by mouth.    [provider]  clonazePAM (KLONOPIN) 0.5 MG tablet Take 0.25-0.5 mg by mouth daily as needed. 02/26/21   [provider]  dicyclomine (BENTYL) 10 MG capsule Take 10 mg by mouth every 6 (six) hours as needed. 02/11/21   [provider]  doxycycline (VIBRAMYCIN) 100 MG capsule Take 1 capsule (100 mg total) by mouth 2 (two) times daily. 08/29/17   Petrucelli, Samantha R, PA-C  esomeprazole (NEXIUM) 20 MG capsule Take 20 mg by mouth daily at 12 noon.    [provider]  gabapentin (NEURONTIN) 400 MG capsule Take 400 mg by mouth 3 (three) times daily. 01/26/21  [provider]  guaifenesin (ROBITUSSIN) 100 MG/5ML syrup Take by mouth. 03/04/16   [provider]  Guselkumab 100 MG/ML SOPN Inject into the skin. 11/15/17   [provider]  HYDROcodone-acetaminophen (NORCO/VICODIN) 5-325 MG tablet Take 1 tablet by mouth every 4 (four) hours as needed for moderate pain or severe pain. 09/27/16   [name], PA-C  medroxyPROGESTERone (DEPO-PROVERA) 150 MG/ML injection Inject 150 mg into the muscle every 3 (three) months.    [provider]  methocarbamol (ROBAXIN) 500 MG tablet Take 1 tablet (500 mg total) by mouth every 8 (eight) hours as needed for muscle spasms. 03/10/21   Veryl Speak, MD  Multiple Vitamin (MULTIVITAMIN) tablet Take 1 tablet by mouth daily.    [provider]  nystatin cream (MYCOSTATIN) Apply to affected area 2 times daily until resolution 04/16/16   Gloriann Loan, PA-C  omeprazole (PRILOSEC) 40 MG capsule Take 40 mg by mouth 2 (two) times daily. 02/11/21   [provider]  oxyCODONE (ROXICODONE) 5 MG immediate release tablet Take 1 tablet (5 mg total) by mouth every 6 (six) hours as needed for severe pain or breakthrough pain. 04/18/21   Kommor, [place], MD  oxyCODONE-acetaminophen (PERCOCET/ROXICET) 5-325 MG tablet Take 1 tablet by mouth every 6 (six) hours as needed for severe pain. 08/29/17   Petrucelli, Samantha R, PA-C  traMADol (ULTRAM) 50 MG tablet Take 1 tablet (50 mg total) by mouth every 6 (six) hours as needed. 04/16/16   Gloriann Loan, PA-C  zolpidem (AMBIEN) 5 MG tablet Take 5 mg by mouth at bedtime as needed for sleep.    [provider]    Allergies    Chocolate, Aspirin, Ibuprofen, and Tramadol  Review of Systems   Review of Systems  Constitutional:  Negative for chills and fever.  Cardiovascular:  Negative for chest pain and palpitations.  Gastrointestinal:  Negative for abdominal pain and vomiting.  Musculoskeletal:  Positive for arthralgias and myalgias.  Skin:  Positive for rash. Negative for wound.  Neurological:  Negative for syncope, weakness and numbness.  All other systems reviewed and are negative.  Physical Exam Updated Vital Signs BP 114/75 (BP Location: Right Arm)   Pulse 83   Temp 98 F (36.7 C) (Oral)   Resp 18   Ht 5\' 7"  (1.702 m)   Wt 95 kg   LMP 04/21/2021 (Exact Date)   SpO2 99%   BMI 32.80 kg/m   Physical Exam Constitutional:      General: She is not in acute distress. HENT:     Head: Normocephalic and atraumatic.  Cardiovascular:     Rate and Rhythm: Normal rate and regular rhythm.  Pulmonary:     Effort: Pulmonary effort is normal. No respiratory distress.  Musculoskeletal:     Comments: Erythema overlying left 3rd MCP TTP distal to left 3rd MCP, no obvious  deformity Limited ability to fully extend left 3rd finger No tenderness or pain of the proximal hand, wrist, or forearm, or remaining fingers of left hand  Skin:    General: Skin is warm and dry.  Neurological:     General: No focal deficit present.     Mental Status: She is alert and oriented to person, place, and time. Mental status is at baseline.  Psychiatric:        Mood and Affect: Mood normal.        Behavior: Behavior normal.    ED Results / Procedures / Treatments   Labs (all labs ordered  are listed, but only abnormal results are displayed) Labs Reviewed - No data to display  EKG None  Radiology DG Hand Complete Left  Result Date: 04/27/2021 CLINICAL DATA:  Trauma, fall EXAM: LEFT HAND - COMPLETE 3+ VIEW COMPARISON:  None. FINDINGS: There is no evidence of fracture or dislocation. There is no evidence of arthropathy or other focal bone abnormality. Soft tissues are unremarkable. IMPRESSION: No fracture or dislocation is seen in the left hand. Electronically Signed   By: Elmer Picker M.D.   On: 04/27/2021 08:31    Procedures Procedures   Medications Ordered in ED Medications  oxyCODONE-acetaminophen (PERCOCET/ROXICET) 5-325 MG per tablet 1 tablet (1 tablet Oral Given 04/27/21 8242)    ED Course  I have reviewed the triage vital signs and the nursing notes.  Pertinent labs & imaging results that were available during my care of the patient were reviewed by me and considered in my medical decision making (see chart for details).  Suspected traumatic fracture of left 3rd digit. X-rays ordered and reviewed showing no acute fracture Percocet given for pain  Question of extensor tendon injury as well - would benefit from splint and hand surgeon referral if not improving  I doubt this is an infection or septic joint without mechanism of injury.  No other signs or symptoms of infection per history or exam.     Final Clinical Impression(s) / ED Diagnoses Final  diagnoses:  Injury of finger of left hand, initial encounter    Rx / DC Orders ED Discharge Orders          Ordered    oxyCODONE-acetaminophen (PERCOCET/ROXICET) 5-325 MG tablet  Every 8 hours PRN        04/27/21 0851             Wyvonnia Dusky, MD 04/27/21 1615

## 2021-05-09 ENCOUNTER — Other Ambulatory Visit (HOSPITAL_BASED_OUTPATIENT_CLINIC_OR_DEPARTMENT_OTHER): Payer: Self-pay

## 2021-05-09 MED ORDER — PFIZER COVID-19 VAC BIVALENT 30 MCG/0.3ML IM SUSP
INTRAMUSCULAR | 0 refills | Status: DC
  Filled 2021-05-09: qty 0.3, 1d supply, fill #0

## 2021-05-11 ENCOUNTER — Other Ambulatory Visit (HOSPITAL_BASED_OUTPATIENT_CLINIC_OR_DEPARTMENT_OTHER): Payer: Self-pay

## 2021-05-17 ENCOUNTER — Other Ambulatory Visit: Payer: Self-pay

## 2021-05-17 ENCOUNTER — Encounter (HOSPITAL_BASED_OUTPATIENT_CLINIC_OR_DEPARTMENT_OTHER): Payer: Self-pay | Admitting: Emergency Medicine

## 2021-05-17 DIAGNOSIS — Z85820 Personal history of malignant melanoma of skin: Secondary | ICD-10-CM | POA: Diagnosis not present

## 2021-05-17 DIAGNOSIS — M25562 Pain in left knee: Secondary | ICD-10-CM | POA: Diagnosis not present

## 2021-05-17 DIAGNOSIS — F17210 Nicotine dependence, cigarettes, uncomplicated: Secondary | ICD-10-CM | POA: Insufficient documentation

## 2021-05-17 NOTE — ED Triage Notes (Signed)
Reports she fractured the left knee in November.  Referred to ortho.  Currently doing therapy and doing ice therapy and elevating.  Reports the pain is getting worse.  Taking tylenol with no relief.

## 2021-05-18 ENCOUNTER — Emergency Department (HOSPITAL_BASED_OUTPATIENT_CLINIC_OR_DEPARTMENT_OTHER)
Admission: EM | Admit: 2021-05-18 | Discharge: 2021-05-18 | Disposition: A | Discharge status: Home / Self Care | Payer: Medicare Other | Attending: Emergency Medicine | Admitting: Emergency Medicine

## 2021-05-18 ENCOUNTER — Encounter (HOSPITAL_BASED_OUTPATIENT_CLINIC_OR_DEPARTMENT_OTHER): Payer: Self-pay

## 2021-05-18 DIAGNOSIS — M25562 Pain in left knee: Secondary | ICD-10-CM

## 2021-05-18 MED ORDER — ACETAMINOPHEN 500 MG PO TABS
1000.0000 mg | ORAL_TABLET | Freq: Once | ORAL | Status: AC
  Administered 2021-05-18: 1000 mg via ORAL
  Filled 2021-05-18: qty 2

## 2021-05-18 MED ORDER — OXYCODONE HCL 5 MG PO TABS
5.0000 mg | ORAL_TABLET | Freq: Once | ORAL | Status: AC
Start: 2021-05-18 — End: 2021-05-18
  Administered 2021-05-18: 5 mg via ORAL
  Filled 2021-05-18: qty 1

## 2021-05-18 NOTE — ED Notes (Signed)
Patient discharged to home.  All discharge instructions reviewed.  Patient verbalized understanding via teachback method.  VS WDL.  Respirations even and unlabored.  Ambulatory out of ED.   °

## 2021-05-18 NOTE — ED Provider Notes (Signed)
[facility] Provider Note  CSN: [id number] Arrival date & time: 05/17/21 2251  Chief Complaint(s) Knee Pain  HPI Michelle Hill is a 40 y.o. female here for exacerbation of left knee pain related to recent fracture.  Patient followed by orthopedic surgery and instructed not to use knee immobilizer or crutches.  She reports that while getting out of bed tonight, she missed stepped and exacerbated pain in the left knee.  Pain is severe and throbbing.  Worse with ambulation.  She denies falling or additional trauma.  No leg swelling.  No other physical complaints.   Knee Pain  Past Medical History Past Medical History:  Diagnosis Date   Crohn's disease ([place])    Gastroenteritis    Psoriasis    Patient Active Problem List   Diagnosis Date Noted   Anxiety 07/10/2015   Clinical depression 07/10/2015   Right ankle pain 07/10/2015   Injury of ureter 01/02/2014   Malignant melanoma ([place]) 08/08/2013   Excess weight 08/08/2013   Crohn's disease of colon ([place]) 08/06/2013   Neck pain 07/25/2013   Psoriasis 02/10/2012   Hidradenitis 08/09/2011   Left shoulder pain 07/29/2011   Home Medication(s) Prior to Admission medications   Medication Sig Start Date End Date Taking? Authorizing Provider  acetaminophen (TYLENOL) 325 MG tablet Take 650 mg by mouth every 6 (six) hours as needed.    [provider]  Adalimumab (HUMIRA) 40 MG/0.8ML PSKT Inject into the skin.    [provider]  ALPRAZolam Duanne Moron) 0.5 MG tablet Take by mouth.    [provider]  atorvastatin (LIPITOR) 20 MG tablet Take 20 mg by mouth at bedtime. 02/13/21   [provider]  cholecalciferol (VITAMIN D) 400 UNITS TABS tablet Take 400 Units by mouth.    [provider]  clonazePAM (KLONOPIN) 0.5 MG tablet Take 0.25-0.5 mg by mouth daily as needed. 02/26/21   [provider]  COVID-19 mRNA bivalent vaccine, Pfizer, (PFIZER COVID-19 VAC  BIVALENT) injection Inject into the muscle. 04/27/21   Carlyle Basques, MD  dicyclomine (BENTYL) 10 MG capsule Take 10 mg by mouth every 6 (six) hours as needed. 02/11/21   [provider]  doxycycline (VIBRAMYCIN) 100 MG capsule Take 1 capsule (100 mg total) by mouth 2 (two) times daily. 08/29/17   Petrucelli, Samantha R, PA-C  esomeprazole (NEXIUM) 20 MG capsule Take 20 mg by mouth daily at 12 noon.    [provider]  gabapentin (NEURONTIN) 400 MG capsule Take 400 mg by mouth 3 (three) times daily. 01/26/21   [provider]  guaifenesin (ROBITUSSIN) 100 MG/5ML syrup Take by mouth. 03/04/16   [provider]  Guselkumab 100 MG/ML SOPN Inject into the skin. 11/15/17   [provider]  HYDROcodone-acetaminophen (NORCO/VICODIN) 5-325 MG tablet Take 1 tablet by mouth every 4 (four) hours as needed for moderate pain or severe pain. 09/27/16   [name], PA-C  medroxyPROGESTERone (DEPO-PROVERA) 150 MG/ML injection Inject 150 mg into the muscle every 3 (three) months.    [provider]  methocarbamol (ROBAXIN) 500 MG tablet Take 1 tablet (500 mg total) by mouth every 8 (eight) hours as needed for muscle spasms. 03/10/21   Veryl Speak, MD  Multiple Vitamin (MULTIVITAMIN) tablet Take 1 tablet by mouth daily.    [provider]  nystatin cream (MYCOSTATIN) Apply to affected area 2 times daily until resolution 04/16/16   Gloriann Loan, PA-C  omeprazole (PRILOSEC) 40 MG capsule Take 40 mg by  mouth 2 (two) times daily. 02/11/21   [provider]  oxyCODONE (ROXICODONE) 5 MG immediate release tablet Take 1 tablet (5 mg total) by mouth every 6 (six) hours as needed for severe pain or breakthrough pain. 04/18/21   Kommor, [place], MD  oxyCODONE-acetaminophen (PERCOCET/ROXICET) 5-325 MG tablet Take 1 tablet by mouth every 6 (six) hours as needed for severe pain. 08/29/17   Petrucelli, Samantha R, PA-C  oxyCODONE-acetaminophen (PERCOCET/ROXICET) 5-325  MG tablet Take 1 tablet by mouth every 8 (eight) hours as needed for severe pain. 04/27/21   Wyvonnia Dusky, MD  traMADol (ULTRAM) 50 MG tablet Take 1 tablet (50 mg total) by mouth every 6 (six) hours as needed. 04/16/16   Gloriann Loan, PA-C  zolpidem (AMBIEN) 5 MG tablet Take 5 mg by mouth at bedtime as needed for sleep.    [provider]                                                                                                                                    Past Surgical History Past Surgical History:  Procedure Laterality Date   ABDOMINAL SURGERY     ILEOSTOMY     kidney stent     NEPHROSTOMY     Family History Family History  Problem Relation Age of Onset   Hyperlipidemia Father    Hypertension Father    Diabetes Neg Hx    Heart attack Neg Hx    Sudden death Neg Hx     Social History Social History   Tobacco Use   Smoking status: Every Day    Packs/day: 0.50    Types: Cigarettes   Smokeless tobacco: Never  Vaping Use   Vaping Use: Never used  Substance Use Topics   Alcohol use: No    Alcohol/week: 0.0 standard drinks    Comment: occ   Drug use: No   Allergies Chocolate, Aspirin, Ibuprofen, and Tramadol  Review of Systems Review of Systems All other systems are reviewed and are negative for acute change except as noted in the HPI  Physical Exam Vital Signs  I have reviewed the triage vital signs BP 120/85 (BP Location: Right Arm)   Pulse 92   Temp 98.3 F (36.8 C) (Oral)   Resp 16   Ht 5\' 7"  (1.702 m)   Wt 95.3 kg   LMP 04/21/2021 (Exact Date)   SpO2 97%   BMI 32.89 kg/m   Physical Exam Vitals reviewed.  Constitutional:      General: She is not in acute distress.    Appearance: She is well-developed. She is not diaphoretic.  HENT:     Head: Normocephalic and atraumatic.     Right Ear: External ear normal.     Left Ear: External ear normal.     Nose: Nose normal.  Eyes:     General: No scleral icterus.     Conjunctiva/sclera: Conjunctivae normal.  Neck:  Trachea: Phonation normal.  Cardiovascular:     Rate and Rhythm: Normal rate and regular rhythm.  Pulmonary:     Effort: Pulmonary effort is normal. No respiratory distress.     Breath sounds: No stridor.  Abdominal:     General: There is no distension.  Musculoskeletal:        General: Normal range of motion.     Cervical back: Normal range of motion.     Left knee: No swelling or effusion. Tenderness present over the medial joint line. Normal pulse.  Neurological:     Mental Status: She is alert and oriented to person, place, and time.  Psychiatric:        Behavior: Behavior normal.    ED Results and Treatments Labs (all labs ordered are listed, but only abnormal results are displayed) Labs Reviewed - No data to display                                                                                                                       EKG  EKG Interpretation  Date/Time:    Ventricular Rate:    PR Interval:    QRS Duration:   QT Interval:    QTC Calculation:   R Axis:     Text Interpretation:         Radiology No results found.  Pertinent labs & imaging results that were available during my care of the patient were reviewed by me and considered in my medical decision making (see MDM for details).  Medications Ordered in ED Medications  acetaminophen (TYLENOL) tablet 1,000 mg (1,000 mg Oral Given 05/18/21 0316)  oxyCODONE (Oxy IR/ROXICODONE) immediate release tablet 5 mg (5 mg Oral Given 05/18/21 0316)                                                                                                                                     Procedures Procedures  (including critical care time)  Medical Decision Making / ED Course I have reviewed the nursing notes for this encounter and the patient's prior records (if available in EHR or on provided paperwork).  Michelle Hill was evaluated in Emergency Department  on 05/18/2021 for the symptoms described in the history of present illness. She was evaluated in the context of the global COVID-19 pandemic, which necessitated consideration that the patient might be at risk for infection with the SARS-CoV-2 virus that  causes COVID-19. Institutional protocols and algorithms that pertain to the evaluation of patients at risk for COVID-19 are in a state of rapid change based on information released by regulatory bodies including the CDC and federal and state organizations. These policies and algorithms were followed during the patient's care in the ED.     Exacerbation of known fracture. No additional falls or trauma. Provided with oral pain medicine and hinged brace. Recommended she follow-up with PCP for continued pain management.  Pertinent labs & imaging results that were available during my care of the patient were reviewed by me and considered in my medical decision making:    Final Clinical Impression(s) / ED Diagnoses Final diagnoses:  Acute pain of left knee   The patient appears reasonably screened and/or stabilized for discharge and I doubt any other medical condition or other [facility] requiring further screening, evaluation, or treatment in the ED at this time prior to discharge. Safe for discharge with strict return precautions.  Disposition: Discharge  Condition: Good  I have discussed the results, Dx and Tx plan with the patient/family who expressed understanding and agree(s) with the plan. Discharge instructions discussed at length. The patient/family was given strict return precautions who verbalized understanding of the instructions. No further questions at time of discharge.    ED Discharge Orders     None        Follow Up: Vincenza Hews, NP [place] [address] [phone]  Call  to schedule an appointment for close follow up for continued pain management     This chart was dictated using  voice recognition software.  Despite best efforts to proofread,  errors can occur which can change the documentation meaning.    Fatima Blank, MD 05/18/21 [phone]

## 2021-05-18 NOTE — Discharge Instructions (Addendum)
For pain control you may take at 1000 mg of Tylenol every 8 hours scheduled.

## 2021-08-17 ENCOUNTER — Ambulatory Visit: Payer: Self-pay | Admitting: Orthopedic Surgery

## 2021-08-25 ENCOUNTER — Ambulatory Visit: Payer: Self-pay | Admitting: Student

## 2021-08-25 NOTE — H&P (Signed)
TOTAL KNEE ADMISSION H&P ? ?Patient is being admitted for left total knee arthroplasty. ? ?Subjective: ? ?Chief Complaint:left knee pain. ? ?HPI: Michelle Hill, 41 y.o. 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  Onset of symptoms was gradual, starting 3 years ago with rapidlly worsening course since that time. The patient noted no past surgery on the left knee(s).  Patient currently rates pain in the left knee(s) at 10 out of 10 with activity. Patient has night pain, worsening of pain with activity and weight bearing, pain that interferes with activities of daily living, pain with passive range of motion, crepitus, and joint swelling.  Patient has evidence of joint space narrowing by imaging studies. MRI images and report of the left knee dated 05/03/2021. Multiple bone infarcts. Large infarct involving the entire aspect of the weightbearing medial femoral condyle with mild depression. Chondromalacia posterior weightbearing surface of the [place]. Lateral discoid meniscus with radial tear. Chronic partial tear ACL. Mild effusion. There is no active infection. ? ?Patient Active Problem List  ? Diagnosis Date Noted  ? Anxiety 07/10/2015  ? Clinical depression 07/10/2015  ? Right ankle pain 07/10/2015  ? 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 Malignant melanoma ([place]) 08/08/2013  ? Excess weight 08/08/2013  ? Crohn's disease of colon ([place]) 08/06/2013  ? Neck pain 07/25/2013  ? Psoriasis 02/10/2012  ? Hidradenitis 08/09/2011  ? Left shoulder pain 07/29/2011  ? ?Past Medical History:  ?Diagnosis Date  ? Crohn's disease ([place])   ? Gastroenteritis   ? Psoriasis   ?  ?Past Surgical History:  ?Procedure Laterality Date  ?  ABDOMINAL SURGERY    ? ILEOSTOMY    ? kidney stent    ? NEPHROSTOMY    ?  ?Current Outpatient Medications  ?Medication Sig Dispense Refill Last Dose  ? acetaminophen (TYLENOL) 650 MG CR tablet Take 1,300 mg by mouth every 8 (eight) hours as needed for pain.     ? albuterol (VENTOLIN HFA) 108 (90 Base) MCG/ACT inhaler Inhale 1-2 puffs into the lungs every 6 (six) hours as needed for wheezing or shortness of breath.     ? atorvastatin (LIPITOR) 20 MG tablet Take 20 mg by mouth at bedtime.     ? AZO CRANBERRY GUMMIES PO Take 1 capsule by mouth 2 (two) times daily.     ? buPROPion (WELLBUTRIN XL) 150 MG 24 hr tablet Take 150 mg by mouth daily.     ? Calcium Carbonate-Vitamin D (CALTRATE 600+D PO) Take 1 tablet by mouth daily.     ? cetirizine (ZYRTEC) 10 MG tablet Take 10 mg by mouth daily as needed for allergies.     ? clonazePAM (KLONOPIN) 0.5 MG tablet Take 0.5 mg by mouth daily as needed for anxiety.     ? Cobalamin Combinations (NEURIVA PLUS) CAPS Take 1 capsule by mouth daily.     ? dicyclomine (BENTYL) 10 MG capsule Take 10 mg by mouth every 6 (six) hours as needed for spasms.     ? doxycycline (VIBRA-TABS) 100 MG tablet Take 100 mg by mouth 2 (two) times daily.     ? fluticasone (FLONASE) 50 MCG/ACT nasal spray Place 1 spray into both nostrils daily as needed for allergies.     ? gabapentin (NEURONTIN)  400 MG capsule Take 400 mg by mouth 3 (three) times daily.     ? Guselkumab 100 MG/ML SOPN Inject 100 mg into the skin every 8 (eight) weeks.     ? medroxyPROGESTERone (DEPO-PROVERA) 150 MG/ML injection Inject 150 mg into the muscle every 3 (three) months.     ? Multiple Vitamin (MULTIVITAMIN) tablet Take 1 tablet by mouth daily.     ? omeprazole (PRILOSEC) 40 MG capsule Take 40 mg by mouth 2 (two) times daily.     ? ondansetron (ZOFRAN-ODT) 8 MG disintegrating tablet Take 8 mg by mouth every 8 (eight) hours as needed for nausea or vomiting.     ? Probiotic Product (PROBIOTIC/PREBIOTIC/CRANBERRY) CAPS Take 1  capsule by mouth daily.     ? tiZANidine (ZANAFLEX) 4 MG tablet Take 4 mg by mouth every 8 (eight) hours as needed for muscle spasms.     ? vitamin E 45 MG (100 UNITS) capsule Take by mouth daily.     ? zinc gluconate 50 MG tablet Take 50 mg by mouth daily.     ? ?No current facility-administered medications for this visit.  ? ?Allergies  ?Allergen Reactions  ? Chocolate Anaphylaxis and Hives  ? Aspirin Other (See Comments)  ?  Mouth and stomach irritation  ? Ibuprofen Other (See Comments)  ?  gastritis  ? Escitalopram Oxalate   ?  Drowsiness  ? Tramadol Palpitations  ?  ?Social History  ? ?Tobacco Use  ? Smoking status: Every Day  ?  Packs/day: 0.50  ?  Types: Cigarettes  ? Smokeless tobacco: Never  ?Substance Use Topics  ? Alcohol use: No  ?  Alcohol/week: 0.0 standard drinks  ?  Comment: occ  ?  ?Family History  ?Problem Relation Age of Onset  ? Hyperlipidemia Father   ? Hypertension Father   ? Diabetes Neg Hx   ? Heart attack Neg Hx   ? Sudden death Neg Hx   ?  ? ?Review of Systems  ?Gastrointestinal:   ?     Has ileostomy  ?Musculoskeletal:  Positive for arthralgias and gait problem.  ?All other systems reviewed and are negative. ? ?Objective: ? ?Physical Exam ?Constitutional:   ?   Appearance: Normal appearance.  ?HENT:  ?   Head: Normocephalic and atraumatic.  ?   Nose: Nose normal.  ?   Mouth/Throat:  ?   Mouth: Mucous membranes are moist.  ?   Pharynx: Oropharynx is clear.  ?Eyes:  ?   Extraocular Movements: Extraocular movements intact.  ?   Conjunctiva/sclera: Conjunctivae normal.  ?   Pupils: Pupils are equal, round, and reactive to light.  ?Cardiovascular:  ?   Rate and Rhythm: Normal rate and regular rhythm.  ?Pulmonary:  ?   Effort: Pulmonary effort is normal.  ?   Breath sounds: Normal breath sounds.  ?Abdominal:  ?   General: Abdomen is flat.  ?   Palpations: Abdomen is soft.  ?   Comments: Ileostomy right side  ?Genitourinary: ?   Comments: deferred ?Musculoskeletal:  ?   Cervical back: Normal  range of motion and neck supple.  ?   Left knee: Swelling, bony tenderness and crepitus present. No erythema. Decreased range of motion.  ?Skin: ?   General: Skin is warm and dry.  ?   Capillary Refill: Capillary refill takes less than 2 seconds.  ?Neurological:  ?   General: No focal deficit present.  ?   Mental Status: She is alert and oriented  to person, place, and time.  ?Psychiatric:     ?   Mood and Affect: Mood normal.     ?   Behavior: Behavior normal.     ?   Thought Content: Thought content normal.     ?   Judgment: Judgment normal.  ? ? ?Vital signs in last 24 hours: ?'@VSRANGES'$ @ ? ?Labs: ? ? ?Estimated body mass index is 32.89 kg/m? as calculated from the following: ?  Height as of 05/17/21: '5\' 7"'$  (1.702 m). ?  Weight as of 05/17/21: 95.3 kg. ? ? ?Imaging Review ?Plain radiographs demonstrate severe degenerative joint disease of the left knee(s). The overall alignment ismild valgus. The bone quality appears to be adequate for age and reported activity level. ? ? ? ? ? ?Assessment/Plan: ? ?End stage arthritis, left knee  ? ?The patient history, physical examination, clinical judgment of the provider and imaging studies are consistent with end stage degenerative joint disease of the left knee(s) and total knee arthroplasty is deemed medically necessary. The treatment options including medical management, injection therapy arthroscopy and arthroplasty were discussed at length. The risks and benefits of total knee arthroplasty were presented and reviewed. The risks due to aseptic loosening, infection, stiffness, patella tracking problems, thromboembolic complications and other imponderables were discussed. The patient acknowledged the explanation, agreed to proceed with the plan and consent was signed. Patient is being admitted for inpatient treatment for surgery, pain control, PT, OT, prophylactic antibiotics, VTE prophylaxis, progressive ambulation and ADL's and discharge planning. The patient is planning  to be discharged home with OPPT.  ? ? ? ?Patient's anticipated LOS is less than 2 midnights, meeting these requirements: ?- Younger than 44 ?- Lives within 1 hour of care ?- Has a competent adult at home to recover

## 2021-08-25 NOTE — H&P (View-Only) (Signed)
TOTAL KNEE ADMISSION H&P ? ?Patient is being admitted for left total knee arthroplasty. ? ?Subjective: ? ?Chief Complaint:left knee pain. ? ?HPI: Michelle Hill, 41 y.o. 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  Onset of symptoms was gradual, starting 3 years ago with rapidlly worsening course since that time. The patient noted no past surgery on the left knee(s).  Patient currently rates pain in the left knee(s) at 10 out of 10 with activity. Patient has night pain, worsening of pain with activity and weight bearing, pain that interferes with activities of daily living, pain with passive range of motion, crepitus, and joint swelling.  Patient has evidence of joint space narrowing by imaging studies. MRI images and report of the left knee dated 05/03/2021. Multiple bone infarcts. Large infarct involving the entire aspect of the weightbearing medial femoral condyle with mild depression. Chondromalacia posterior weightbearing surface of the [place]. Lateral discoid meniscus with radial tear. Chronic partial tear ACL. Mild effusion. There is no active infection. ? ?Patient Active Problem List  ? Diagnosis Date Noted  ? Anxiety 07/10/2015  ? Clinical depression 07/10/2015  ? Right ankle pain 07/10/2015  ? 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 Malignant melanoma ([place]) 08/08/2013  ? Excess weight 08/08/2013  ? Crohn's disease of colon ([place]) 08/06/2013  ? Neck pain 07/25/2013  ? Psoriasis 02/10/2012  ? Hidradenitis 08/09/2011  ? Left shoulder pain 07/29/2011  ? ?Past Medical History:  ?Diagnosis Date  ? Crohn's disease ([place])   ? Gastroenteritis   ? Psoriasis   ?  ?Past Surgical History:  ?Procedure Laterality Date  ?  ABDOMINAL SURGERY    ? ILEOSTOMY    ? kidney stent    ? NEPHROSTOMY    ?  ?Current Outpatient Medications  ?Medication Sig Dispense Refill Last Dose  ? acetaminophen (TYLENOL) 650 MG CR tablet Take 1,300 mg by mouth every 8 (eight) hours as needed for pain.     ? albuterol (VENTOLIN HFA) 108 (90 Base) MCG/ACT inhaler Inhale 1-2 puffs into the lungs every 6 (six) hours as needed for wheezing or shortness of breath.     ? atorvastatin (LIPITOR) 20 MG tablet Take 20 mg by mouth at bedtime.     ? AZO CRANBERRY GUMMIES PO Take 1 capsule by mouth 2 (two) times daily.     ? buPROPion (WELLBUTRIN XL) 150 MG 24 hr tablet Take 150 mg by mouth daily.     ? Calcium Carbonate-Vitamin D (CALTRATE 600+D PO) Take 1 tablet by mouth daily.     ? cetirizine (ZYRTEC) 10 MG tablet Take 10 mg by mouth daily as needed for allergies.     ? clonazePAM (KLONOPIN) 0.5 MG tablet Take 0.5 mg by mouth daily as needed for anxiety.     ? Cobalamin Combinations (NEURIVA PLUS) CAPS Take 1 capsule by mouth daily.     ? dicyclomine (BENTYL) 10 MG capsule Take 10 mg by mouth every 6 (six) hours as needed for spasms.     ? doxycycline (VIBRA-TABS) 100 MG tablet Take 100 mg by mouth 2 (two) times daily.     ? fluticasone (FLONASE) 50 MCG/ACT nasal spray Place 1 spray into both nostrils daily as needed for allergies.     ? gabapentin (NEURONTIN)  400 MG capsule Take 400 mg by mouth 3 (three) times daily.     ? Guselkumab 100 MG/ML SOPN Inject 100 mg into the skin every 8 (eight) weeks.     ? medroxyPROGESTERone (DEPO-PROVERA) 150 MG/ML injection Inject 150 mg into the muscle every 3 (three) months.     ? Multiple Vitamin (MULTIVITAMIN) tablet Take 1 tablet by mouth daily.     ? omeprazole (PRILOSEC) 40 MG capsule Take 40 mg by mouth 2 (two) times daily.     ? ondansetron (ZOFRAN-ODT) 8 MG disintegrating tablet Take 8 mg by mouth every 8 (eight) hours as needed for nausea or vomiting.     ? Probiotic Product (PROBIOTIC/PREBIOTIC/CRANBERRY) CAPS Take 1  capsule by mouth daily.     ? tiZANidine (ZANAFLEX) 4 MG tablet Take 4 mg by mouth every 8 (eight) hours as needed for muscle spasms.     ? vitamin E 45 MG (100 UNITS) capsule Take by mouth daily.     ? zinc gluconate 50 MG tablet Take 50 mg by mouth daily.     ? ?No current facility-administered medications for this visit.  ? ?Allergies  ?Allergen Reactions  ? Chocolate Anaphylaxis and Hives  ? Aspirin Other (See Comments)  ?  Mouth and stomach irritation  ? Ibuprofen Other (See Comments)  ?  gastritis  ? Escitalopram Oxalate   ?  Drowsiness  ? Tramadol Palpitations  ?  ?Social History  ? ?Tobacco Use  ? Smoking status: Every Day  ?  Packs/day: 0.50  ?  Types: Cigarettes  ? Smokeless tobacco: Never  ?Substance Use Topics  ? Alcohol use: No  ?  Alcohol/week: 0.0 standard drinks  ?  Comment: occ  ?  ?Family History  ?Problem Relation Age of Onset  ? Hyperlipidemia Father   ? Hypertension Father   ? Diabetes Neg Hx   ? Heart attack Neg Hx   ? Sudden death Neg Hx   ?  ? ?Review of Systems  ?Gastrointestinal:   ?     Has ileostomy  ?Musculoskeletal:  Positive for arthralgias and gait problem.  ?All other systems reviewed and are negative. ? ?Objective: ? ?Physical Exam ?Constitutional:   ?   Appearance: Normal appearance.  ?HENT:  ?   Head: Normocephalic and atraumatic.  ?   Nose: Nose normal.  ?   Mouth/Throat:  ?   Mouth: Mucous membranes are moist.  ?   Pharynx: Oropharynx is clear.  ?Eyes:  ?   Extraocular Movements: Extraocular movements intact.  ?   Conjunctiva/sclera: Conjunctivae normal.  ?   Pupils: Pupils are equal, round, and reactive to light.  ?Cardiovascular:  ?   Rate and Rhythm: Normal rate and regular rhythm.  ?Pulmonary:  ?   Effort: Pulmonary effort is normal.  ?   Breath sounds: Normal breath sounds.  ?Abdominal:  ?   General: Abdomen is flat.  ?   Palpations: Abdomen is soft.  ?   Comments: Ileostomy right side  ?Genitourinary: ?   Comments: deferred ?Musculoskeletal:  ?   Cervical back: Normal  range of motion and neck supple.  ?   Left knee: Swelling, bony tenderness and crepitus present. No erythema. Decreased range of motion.  ?Skin: ?   General: Skin is warm and dry.  ?   Capillary Refill: Capillary refill takes less than 2 seconds.  ?Neurological:  ?   General: No focal deficit present.  ?   Mental Status: She is alert and oriented  to person, place, and time.  ?Psychiatric:     ?   Mood and Affect: Mood normal.     ?   Behavior: Behavior normal.     ?   Thought Content: Thought content normal.     ?   Judgment: Judgment normal.  ? ? ?Vital signs in last 24 hours: ?'@VSRANGES'$ @ ? ?Labs: ? ? ?Estimated body mass index is 32.89 kg/m? as calculated from the following: ?  Height as of 05/17/21: '5\' 7"'$  (1.702 m). ?  Weight as of 05/17/21: 95.3 kg. ? ? ?Imaging Review ?Plain radiographs demonstrate severe degenerative joint disease of the left knee(s). The overall alignment ismild valgus. The bone quality appears to be adequate for age and reported activity level. ? ? ? ? ? ?Assessment/Plan: ? ?End stage arthritis, left knee  ? ?The patient history, physical examination, clinical judgment of the provider and imaging studies are consistent with end stage degenerative joint disease of the left knee(s) and total knee arthroplasty is deemed medically necessary. The treatment options including medical management, injection therapy arthroscopy and arthroplasty were discussed at length. The risks and benefits of total knee arthroplasty were presented and reviewed. The risks due to aseptic loosening, infection, stiffness, patella tracking problems, thromboembolic complications and other imponderables were discussed. The patient acknowledged the explanation, agreed to proceed with the plan and consent was signed. Patient is being admitted for inpatient treatment for surgery, pain control, PT, OT, prophylactic antibiotics, VTE prophylaxis, progressive ambulation and ADL's and discharge planning. The patient is planning  to be discharged home with OPPT.  ? ? ? ?Patient's anticipated LOS is less than 2 midnights, meeting these requirements: ?- Younger than 23 ?- Lives within 1 hour of care ?- Has a competent adult at home to recover

## 2021-08-25 NOTE — Patient Instructions (Signed)
DUE TO COVID-19 ONLY ONE VISITOR  (aged 43 and older)  IS ALLOWED TO COME WITH YOU AND STAY IN THE WAITING ROOM ONLY DURING PRE OP AND PROCEDURE.   ?**NO VISITORS ARE ALLOWED IN THE SHORT STAY AREA OR RECOVERY ROOM!!** ? ?IF YOU WILL BE ADMITTED INTO THE HOSPITAL YOU ARE ALLOWED ONLY TWO SUPPORT PEOPLE DURING VISITATION HOURS ONLY (7 AM -8PM)   ?The support person(s) must pass our screening, gel in and out, and wear a mask at all times, including in the patient?s room. ?Patients must also wear a mask when staff or their support person are in the room. ?Visitors GUEST BADGE MUST BE WORN VISIBLY  ?One adult visitor may remain with you overnight and MUST be in the room by 8 P.M. ?  ? ? Your procedure is scheduled on: 09/02/21 ? ? Report to [facility] Main Entrance ? ?  Report to admitting at  7:45 AM ? ? Call this number if you have problems the morning of surgery [phone] ? ? Do not eat food :After Midnight. ? ? After Midnight you may have the following liquids until _7:15_____ AM DAY OF SURGERY ? ?Water ?Black Coffee (sugar ok, NO MILK/CREAM OR CREAMERS)  ?Tea (sugar ok, NO MILK/CREAM OR CREAMERS) regular and decaf                             ?Plain Jell-O (NO RED)                                           ?Fruit ices (not with fruit pulp, NO RED)                                     ?Popsicles (NO RED)                                                                  ?Juice: apple, WHITE grape, WHITE cranberry ?Sports drinks like Gatorade (NO RED) ?Clear broth(vegetable,chicken,beef) ? ?             ? ?  ?  ?The day of surgery:  ?Drink ONE (1) Pre-Surgery Clear Ensure  at 7:00 AM the morning of surgery. Drink in one sitting. Do not sip.  ?This drink was given to you during your hospital  ?pre-op appointment visit. ?Nothing else to drink after completing the  ?Pre-Surgery Clear Ensure at 7:15 am ?  ?       If you have questions, please contact your surgeon?s office. ? ? ?FOLLOW BOWEL PREP AND ANY  ADDITIONAL PRE OP INSTRUCTIONS YOU RECEIVED FROM YOUR SURGEON'S OFFICE!!! ?  ?  ?Oral Hygiene is also important to reduce your risk of infection.                                    ?Remember - BRUSH YOUR TEETH THE MORNING OF SURGERY WITH YOUR REGULAR TOOTHPASTE ? ? Do NOT smoke after Midnight ? ?  Take these medicines the morning of surgery with A SIP OF WATER: Gabapentin, Bupropion, Omeprazole ?                         Use your inhalers and bring them with you ? ? ?Bring CPAP mask and tubing day of surgery. ?                  ?           You may not have any metal on your body including hair pins, jewelry, and body piercing ? ?           Do not wear make-up, lotions, powders, perfumes/cologne, or deodorant ? ?Do not wear nail polish including gel and S&S, artificial/acrylic nails, or any other type of covering on natural nails including finger and toenails. If you have artificial nails, gel coating, etc. that needs to be removed by a nail salon please have this removed prior to surgery or surgery may need to be canceled/ delayed if the surgeon/ anesthesia feels like they are unable to be safely monitored.  ? ?Do not shave  48 hours prior to surgery.  ? ? ? Do not bring valuables to the hospital. [place] NOT ?            RESPONSIBLE   FOR VALUABLES. ? ? Contacts, dentures or bridgework may not be worn into surgery. ? ? Bring small overnight bag day of surgery. ?  ? Patients discharged on the day of surgery will not be allowed to drive home.  Someone NEEDS to stay with you for the first 24 hours after anesthesia. ? ? Special Instructions: Bring a copy of your healthcare power of attorney and living will documents the day of surgery if you haven't scanned them before. ? ?            Please read over the following fact sheets you were given: IF [place] [phone] ? ? ?   [facility] - Preparing for Surgery ?Before surgery, you can play an important role.   Because skin is not sterile, your skin needs to be as free of germs as possible.  You can reduce the number of germs on your skin by washing with CHG (chlorahexidine gluconate) soap before surgery.  CHG is an antiseptic cleaner which kills germs and bonds with the skin to continue killing germs even after washing. ?Please DO NOT use if you have an allergy to CHG or antibacterial soaps.  If your skin becomes reddened/irritated stop using the CHG and inform your nurse when you arrive at Short Stay. ?Do not shave (including legs and underarms) for at least 48 hours prior to the first CHG shower.  ?Please follow these instructions carefully: ? 1.  Shower with CHG Soap the night before surgery and the  morning of Surgery. ? 2.  If you choose to wash your hair, wash your hair first as usual with your  normal  shampoo. ? 3.  After you shampoo, rinse your hair and body thoroughly to remove the  shampoo.                          ?  4.  Use CHG as you would any other liquid soap.  You can apply chg directly  to the skin and wash  ?  Gently with a scrungie or clean washcloth. ? 5.  Apply the CHG Soap to your body ONLY FROM THE NECK DOWN.   Do not use on face/ open      ?                     Wound or open sores. Avoid contact with eyes, ears mouth and genitals (private parts).  ?                     Production manager,  Genitals (private parts) with your normal soap. ?            6.  Wash thoroughly, paying special attention to the area where your surgery  will be performed. ? 7.  Thoroughly rinse your body with warm water from the neck down. ? 8.  DO NOT shower/wash with your normal soap after using and rinsing off  the CHG Soap. ?               9.  Pat yourself dry with a clean towel. ?           10.  Wear clean pajamas. ?           11.  Place clean sheets on your bed the night of your first shower and do not  sleep with pets. ?Day of Surgery : ?Do not apply any lotions/deodorants the morning of surgery.  Please  wear clean clothes to the hospital/surgery center. ? ?FAILURE TO FOLLOW THESE INSTRUCTIONS MAY RESULT IN THE CANCELLATION OF YOUR SURGERY ?PATIENT SIGNATURE_________________________________ ? ?NURSE SIGNATURE__________________________________ ? ?________________________________________________________________________  ? ?Incentive Spirometer ? ?An incentive spirometer is a tool that can help keep your lungs clear and active. This tool measures how well you are filling your lungs with each breath. Taking long deep breaths may help reverse or decrease the chance of developing breathing (pulmonary) problems (especially infection) following: ?A long period of time when you are unable to move or be active. ?BEFORE THE PROCEDURE  ?If the spirometer includes an indicator to show your best effort, your nurse or respiratory therapist will set it to a desired goal. ?If possible, sit up straight or lean slightly forward. Try not to slouch. ?Hold the incentive spirometer in an upright position. ?INSTRUCTIONS FOR USE  ?Sit on the edge of your bed if possible, or sit up as far as you can in bed or on a chair. ?Hold the incentive spirometer in an upright position. ?Breathe out normally. ?Place the mouthpiece in your mouth and seal your lips tightly around it. ?Breathe in slowly and as deeply as possible, raising the piston or the ball toward the top of the column. ?Hold your breath for 3-5 seconds or for as long as possible. Allow the piston or ball to fall to the bottom of the column. ?Remove the mouthpiece from your mouth and breathe out normally. ?Rest for a few seconds and repeat Steps 1 through 7 at least 10 times every 1-2 hours when you are awake. Take your time and take a few normal breaths between deep breaths. ?The spirometer may include an indicator to show your best effort. Use the indicator as a goal to work toward during each repetition. ?After each set of 10 deep breaths, practice coughing to be sure your lungs are  clear. If you have an incision (the cut made at the time of surgery), support your incision when coughing by placing a pillow or rolled up towels  firmly against it. ?Once you are able to get out of bed, walk around

## 2021-08-26 ENCOUNTER — Other Ambulatory Visit: Payer: Self-pay

## 2021-08-26 ENCOUNTER — Encounter (HOSPITAL_COMMUNITY): Payer: Self-pay

## 2021-08-26 ENCOUNTER — Encounter (HOSPITAL_COMMUNITY)
Admission: RE | Admit: 2021-08-26 | Discharge: 2021-08-26 | Disposition: A | Discharge status: Home / Self Care | Payer: Medicare Other | Source: Ambulatory Visit | Attending: Orthopedic Surgery | Admitting: Orthopedic Surgery

## 2021-08-26 VITALS — BP 95/78 | HR 64 | Temp 98.3°F | Resp 18 | Ht 66.0 in | Wt 190.0 lb

## 2021-08-26 DIAGNOSIS — Z01812 Encounter for preprocedural laboratory examination: Secondary | ICD-10-CM | POA: Insufficient documentation

## 2021-08-26 DIAGNOSIS — Z01818 Encounter for other preprocedural examination: Secondary | ICD-10-CM

## 2021-08-26 HISTORY — DX: Unspecified chronic bronchitis: J42

## 2021-08-26 HISTORY — DX: Anemia, unspecified: D64.9

## 2021-08-26 HISTORY — DX: Hidradenitis suppurativa: L73.2

## 2021-08-26 LAB — BASIC METABOLIC PANEL
Anion gap: 8 (ref 5–15)
BUN: 16 mg/dL (ref 6–20)
CO2: 24 mmol/L (ref 22–32)
Calcium: 9.5 mg/dL (ref 8.9–10.3)
Chloride: 108 mmol/L (ref 98–111)
Creatinine, Ser: 0.66 mg/dL (ref 0.44–1.00)
GFR, Estimated: >60 mL/min (ref >60)
Glucose, Bld: 78 mg/dL (ref 70–99)
Potassium: 4.1 mmol/L (ref 3.5–5.1)
Sodium: 140 mmol/L (ref 135–145)

## 2021-08-26 LAB — CBC
HCT: 39.9 % (ref 36.0–46.0)
Hemoglobin: 13 g/dL (ref 12.0–15.0)
MCH: 32.4 pg (ref 26.0–34.0)
MCHC: 32.6 g/dL (ref 30.0–36.0)
MCV: 99.5 fL (ref 80.0–100.0)
Platelets: 363 10*3/uL (ref 150–400)
RBC: 4.01 MIL/uL (ref 3.87–5.11)
RDW: 13.7 % (ref 11.5–15.5)
WBC: 8.6 10*3/uL (ref 4.0–10.5)
nRBC: 0 % (ref 0.0–0.2)

## 2021-08-26 LAB — SURGICAL PCR SCREEN
MRSA, PCR: NEGATIVE
Staphylococcus aureus: NEGATIVE

## 2021-08-26 NOTE — Progress Notes (Signed)
?  Bowel prep reminder:NA ? ?PCP - Dr. Laurene Footman ?Cardiologist - none ? ?Chest x-ray - no ?EKG - no ?Stress Test - no ?ECHO - no ?Cardiac Cath - no ?Pacemaker/ICD device last checked:NA ? ?Sleep Study - no ?CPAP -  ? ?Fasting Blood Sugar - NA ?Checks Blood Sugar _____ times a day ? ?Blood Thinner Instructions:NA ?Aspirin Instructions: ?Last Dose: ? ?Anesthesia review: yes ? ?Patient denies shortness of breath, fever, cough and chest pain at PAT appointment ?Pt uses an inhaler daily for chronic bronchitis that is well controlled at this time. She has crohn's disease  and an ileostomy in place. She had complications with surgery in 2015 resulting in nephrostomy, stent placement and sepsis. She has had multiple surgeries for Hidradenitis. ? ?Patient verbalized understanding of instructions that were given to them at the PAT appointment. Patient was also instructed that they will need to review over the PAT instructions again at home before surgery. yes ?

## 2021-09-02 ENCOUNTER — Encounter (HOSPITAL_COMMUNITY): Payer: Self-pay | Admitting: Orthopedic Surgery

## 2021-09-02 ENCOUNTER — Observation Stay (HOSPITAL_COMMUNITY)
Admission: RE | Admit: 2021-09-02 | Discharge: 2021-09-03 | Disposition: A | Discharge status: Home / Self Care | Payer: Medicare Other | Source: Ambulatory Visit | Attending: Orthopedic Surgery | Admitting: Orthopedic Surgery

## 2021-09-02 ENCOUNTER — Ambulatory Visit (HOSPITAL_COMMUNITY): Payer: Medicare Other | Admitting: Certified Registered"

## 2021-09-02 ENCOUNTER — Encounter (HOSPITAL_COMMUNITY)
Admission: RE | Disposition: A | Discharge status: Home / Self Care | Payer: Self-pay | Source: Ambulatory Visit | Attending: Orthopedic Surgery

## 2021-09-02 ENCOUNTER — Other Ambulatory Visit: Payer: Self-pay

## 2021-09-02 ENCOUNTER — Ambulatory Visit (HOSPITAL_BASED_OUTPATIENT_CLINIC_OR_DEPARTMENT_OTHER): Payer: Medicare Other | Admitting: Certified Registered"

## 2021-09-02 ENCOUNTER — Ambulatory Visit (HOSPITAL_COMMUNITY): Payer: Medicare Other

## 2021-09-02 DIAGNOSIS — Z85820 Personal history of malignant melanoma of skin: Secondary | ICD-10-CM | POA: Insufficient documentation

## 2021-09-02 DIAGNOSIS — X58XXXA Exposure to other specified factors, initial encounter: Secondary | ICD-10-CM | POA: Diagnosis not present

## 2021-09-02 DIAGNOSIS — S83512A Sprain of anterior cruciate ligament of left knee, initial encounter: Secondary | ICD-10-CM | POA: Diagnosis not present

## 2021-09-02 DIAGNOSIS — F17210 Nicotine dependence, cigarettes, uncomplicated: Secondary | ICD-10-CM | POA: Insufficient documentation

## 2021-09-02 DIAGNOSIS — M1712 Unilateral primary osteoarthritis, left knee: Secondary | ICD-10-CM | POA: Insufficient documentation

## 2021-09-02 DIAGNOSIS — Z01818 Encounter for other preprocedural examination: Secondary | ICD-10-CM

## 2021-09-02 DIAGNOSIS — M94262 Chondromalacia, left knee: Secondary | ICD-10-CM | POA: Diagnosis not present

## 2021-09-02 DIAGNOSIS — S83282A Other tear of lateral meniscus, current injury, left knee, initial encounter: Secondary | ICD-10-CM | POA: Diagnosis not present

## 2021-09-02 DIAGNOSIS — M879 Osteonecrosis, unspecified: Secondary | ICD-10-CM | POA: Diagnosis not present

## 2021-09-02 DIAGNOSIS — Z79899 Other long term (current) drug therapy: Secondary | ICD-10-CM | POA: Diagnosis not present

## 2021-09-02 DIAGNOSIS — M25462 Effusion, left knee: Secondary | ICD-10-CM | POA: Diagnosis not present

## 2021-09-02 HISTORY — PX: KNEE ARTHROPLASTY: SHX992

## 2021-09-02 LAB — PREGNANCY, URINE: Preg Test, Ur: NEGATIVE

## 2021-09-02 SURGERY — ARTHROPLASTY, KNEE, TOTAL, USING IMAGELESS COMPUTER-ASSISTED NAVIGATION
Anesthesia: Spinal | Site: Knee | Laterality: Left

## 2021-09-02 MED ORDER — METOCLOPRAMIDE HCL 5 MG/ML IJ SOLN: 5.0000 mg | Freq: Three times a day (TID) | INTRAMUSCULAR | Status: DC | PRN

## 2021-09-02 MED ORDER — PROPOFOL 500 MG/50ML IV EMUL
INTRAVENOUS | Status: AC
  Filled 2021-09-02: qty 50

## 2021-09-02 MED ORDER — DEXAMETHASONE SODIUM PHOSPHATE 10 MG/ML IJ SOLN
INTRAMUSCULAR | Status: AC
  Filled 2021-09-02: qty 1

## 2021-09-02 MED ORDER — LACTATED RINGERS IV SOLN: INTRAVENOUS | Status: DC

## 2021-09-02 MED ORDER — PROPOFOL 1000 MG/100ML IV EMUL
INTRAVENOUS | Status: AC
  Filled 2021-09-02: qty 100

## 2021-09-02 MED ORDER — ALUM & MAG HYDROXIDE-SIMETH 200-200-20 MG/5ML PO SUSP: 30.0000 mL | ORAL | Status: DC | PRN

## 2021-09-02 MED ORDER — BUPIVACAINE-EPINEPHRINE (PF) 0.5% -1:200000 IJ SOLN
INTRAMUSCULAR | Status: DC | PRN
  Administered 2021-09-02: 20 mL via PERINEURAL

## 2021-09-02 MED ORDER — CLONAZEPAM 0.5 MG PO TABS
0.5000 mg | ORAL_TABLET | Freq: Every day | ORAL | Status: DC | PRN
  Administered 2021-09-03: 0.5 mg via ORAL
  Filled 2021-09-02: qty 1

## 2021-09-02 MED ORDER — FENTANYL CITRATE (PF) 100 MCG/2ML IJ SOLN
INTRAMUSCULAR | Status: AC
  Filled 2021-09-02: qty 2

## 2021-09-02 MED ORDER — PHENOL 1.4 % MT LIQD: 1.0000 | OROMUCOSAL | Status: DC | PRN

## 2021-09-02 MED ORDER — KETOROLAC TROMETHAMINE 30 MG/ML IJ SOLN
INTRAMUSCULAR | Status: DC | PRN
  Administered 2021-09-02: 30 mg via INTRAVENOUS

## 2021-09-02 MED ORDER — METHOCARBAMOL 500 MG PO TABS
500.0000 mg | ORAL_TABLET | Freq: Four times a day (QID) | ORAL | Status: DC | PRN
  Administered 2021-09-02 – 2021-09-03 (×3): 500 mg via ORAL
  Filled 2021-09-02 (×3): qty 1

## 2021-09-02 MED ORDER — ATORVASTATIN CALCIUM 20 MG PO TABS
20.0000 mg | ORAL_TABLET | Freq: Every day | ORAL | Status: DC
  Administered 2021-09-02: 20 mg via ORAL
  Filled 2021-09-02: qty 1

## 2021-09-02 MED ORDER — CHLORHEXIDINE GLUCONATE 0.12 % MT SOLN
15.0000 mL | Freq: Once | OROMUCOSAL | Status: AC
  Administered 2021-09-02: 15 mL via OROMUCOSAL

## 2021-09-02 MED ORDER — KETOROLAC TROMETHAMINE 15 MG/ML IJ SOLN
15.0000 mg | Freq: Four times a day (QID) | INTRAMUSCULAR | Status: AC
  Administered 2021-09-02 – 2021-09-03 (×4): 15 mg via INTRAVENOUS
  Filled 2021-09-02 (×4): qty 1

## 2021-09-02 MED ORDER — ONDANSETRON HCL 4 MG/2ML IJ SOLN
INTRAMUSCULAR | Status: DC | PRN
  Administered 2021-09-02: 4 mg via INTRAVENOUS

## 2021-09-02 MED ORDER — PROPOFOL 10 MG/ML IV BOLUS
INTRAVENOUS | Status: DC | PRN
  Administered 2021-09-02 (×3): 20 mg via INTRAVENOUS

## 2021-09-02 MED ORDER — PANTOPRAZOLE SODIUM 40 MG PO TBEC
80.0000 mg | DELAYED_RELEASE_TABLET | Freq: Every day | ORAL | Status: DC
Start: 2021-09-02 — End: 2021-09-03
  Administered 2021-09-02 – 2021-09-03 (×2): 80 mg via ORAL
  Filled 2021-09-02 (×2): qty 2

## 2021-09-02 MED ORDER — POLYETHYLENE GLYCOL 3350 17 G PO PACK: 17.0000 g | PACK | Freq: Every day | ORAL | Status: DC | PRN

## 2021-09-02 MED ORDER — DEXAMETHASONE SODIUM PHOSPHATE 10 MG/ML IJ SOLN
INTRAMUSCULAR | Status: DC | PRN
  Administered 2021-09-02: 10 mg via INTRAVENOUS

## 2021-09-02 MED ORDER — MIDAZOLAM HCL 2 MG/2ML IJ SOLN
1.0000 mg | INTRAMUSCULAR | Status: DC
  Administered 2021-09-02: 2 mg via INTRAVENOUS
  Filled 2021-09-02: qty 2

## 2021-09-02 MED ORDER — ACETAMINOPHEN 325 MG PO TABS
325.0000 mg | ORAL_TABLET | Freq: Four times a day (QID) | ORAL | Status: DC | PRN
  Filled 2021-09-02: qty 2

## 2021-09-02 MED ORDER — CEFAZOLIN SODIUM-DEXTROSE 2-4 GM/100ML-% IV SOLN
2.0000 g | Freq: Four times a day (QID) | INTRAVENOUS | Status: AC
  Administered 2021-09-02 (×2): 2 g via INTRAVENOUS
  Filled 2021-09-02 (×2): qty 100

## 2021-09-02 MED ORDER — POVIDONE-IODINE 10 % EX SWAB: 2.0000 "application " | Freq: Once | CUTANEOUS | Status: DC

## 2021-09-02 MED ORDER — METHOCARBAMOL 500 MG IVPB - SIMPLE MED
500.0000 mg | Freq: Four times a day (QID) | INTRAVENOUS | Status: DC | PRN
  Administered 2021-09-02: 500 mg via INTRAVENOUS
  Filled 2021-09-02: qty 50

## 2021-09-02 MED ORDER — FENTANYL CITRATE PF 50 MCG/ML IJ SOSY
50.0000 ug | PREFILLED_SYRINGE | INTRAMUSCULAR | Status: DC
  Administered 2021-09-02: 100 ug via INTRAVENOUS
  Filled 2021-09-02: qty 2

## 2021-09-02 MED ORDER — OXYCODONE HCL 5 MG PO TABS: 5.0000 mg | ORAL_TABLET | ORAL | Status: DC | PRN

## 2021-09-02 MED ORDER — CEFAZOLIN SODIUM-DEXTROSE 2-4 GM/100ML-% IV SOLN
2.0000 g | INTRAVENOUS | Status: AC
  Administered 2021-09-02: 2 g via INTRAVENOUS
  Filled 2021-09-02: qty 100

## 2021-09-02 MED ORDER — DIPHENHYDRAMINE HCL 12.5 MG/5ML PO ELIX: 12.5000 mg | ORAL_SOLUTION | ORAL | Status: DC | PRN

## 2021-09-02 MED ORDER — GABAPENTIN 400 MG PO CAPS
400.0000 mg | ORAL_CAPSULE | Freq: Three times a day (TID) | ORAL | Status: DC
  Administered 2021-09-02 – 2021-09-03 (×4): 400 mg via ORAL
  Filled 2021-09-02 (×4): qty 1

## 2021-09-02 MED ORDER — SODIUM CHLORIDE 0.9 % IV SOLN: INTRAVENOUS | Status: DC

## 2021-09-02 MED ORDER — BUPIVACAINE-EPINEPHRINE 0.25% -1:200000 IJ SOLN
INTRAMUSCULAR | Status: DC | PRN
Start: 2021-09-02 — End: 2021-09-02
  Administered 2021-09-02: 30 mL

## 2021-09-02 MED ORDER — FLUTICASONE PROPIONATE 50 MCG/ACT NA SUSP: 1.0000 | Freq: Every day | NASAL | Status: DC | PRN

## 2021-09-02 MED ORDER — KETOROLAC TROMETHAMINE 30 MG/ML IJ SOLN
INTRAMUSCULAR | Status: AC
  Filled 2021-09-02: qty 1

## 2021-09-02 MED ORDER — LORATADINE 10 MG PO TABS
10.0000 mg | ORAL_TABLET | Freq: Every day | ORAL | Status: DC
Start: 2021-09-02 — End: 2021-09-03
  Administered 2021-09-02 – 2021-09-03 (×2): 10 mg via ORAL
  Filled 2021-09-02 (×2): qty 1

## 2021-09-02 MED ORDER — OXYCODONE HCL 5 MG PO TABS
10.0000 mg | ORAL_TABLET | ORAL | Status: DC | PRN
  Administered 2021-09-02 – 2021-09-03 (×5): 15 mg via ORAL
  Filled 2021-09-02 (×6): qty 3

## 2021-09-02 MED ORDER — HYDROMORPHONE HCL 1 MG/ML IJ SOLN
INTRAMUSCULAR | Status: AC
  Filled 2021-09-02: qty 2

## 2021-09-02 MED ORDER — SENNA 8.6 MG PO TABS
1.0000 | ORAL_TABLET | Freq: Two times a day (BID) | ORAL | Status: DC
  Administered 2021-09-02 – 2021-09-03 (×2): 8.6 mg via ORAL
  Filled 2021-09-02 (×2): qty 1

## 2021-09-02 MED ORDER — BUPIVACAINE IN DEXTROSE 0.75-8.25 % IT SOLN
INTRATHECAL | Status: DC | PRN
  Administered 2021-09-02: 2 mL via INTRATHECAL

## 2021-09-02 MED ORDER — ACETAMINOPHEN 500 MG PO TABS
1000.0000 mg | ORAL_TABLET | Freq: Once | ORAL | Status: AC
  Administered 2021-09-02: 1000 mg via ORAL
  Filled 2021-09-02: qty 2

## 2021-09-02 MED ORDER — ONDANSETRON HCL 4 MG/2ML IJ SOLN: 4.0000 mg | Freq: Four times a day (QID) | INTRAMUSCULAR | Status: DC | PRN

## 2021-09-02 MED ORDER — POVIDONE-IODINE 10 % EX SWAB
2.0000 "application " | Freq: Once | CUTANEOUS | Status: AC
  Administered 2021-09-02: 2 via TOPICAL

## 2021-09-02 MED ORDER — PHENYLEPHRINE HCL-NACL 20-0.9 MG/250ML-% IV SOLN
INTRAVENOUS | Status: DC | PRN
  Administered 2021-09-02: 50 ug/min via INTRAVENOUS

## 2021-09-02 MED ORDER — SODIUM CHLORIDE (PF) 0.9 % IJ SOLN
INTRAMUSCULAR | Status: AC
  Filled 2021-09-02: qty 50

## 2021-09-02 MED ORDER — METOCLOPRAMIDE HCL 5 MG PO TABS: 5.0000 mg | ORAL_TABLET | Freq: Three times a day (TID) | ORAL | Status: DC | PRN

## 2021-09-02 MED ORDER — SODIUM CHLORIDE 0.9% IV SOLUTION
INTRAVENOUS | Status: AC | PRN
  Administered 2021-09-02: 1000 mL via INTRAMUSCULAR

## 2021-09-02 MED ORDER — TRANEXAMIC ACID-NACL 1000-0.7 MG/100ML-% IV SOLN
1000.0000 mg | INTRAVENOUS | Status: AC
  Administered 2021-09-02: 1000 mg via INTRAVENOUS
  Filled 2021-09-02: qty 100

## 2021-09-02 MED ORDER — ORAL CARE MOUTH RINSE: 15.0000 mL | Freq: Once | OROMUCOSAL | Status: AC

## 2021-09-02 MED ORDER — ALBUTEROL SULFATE (2.5 MG/3ML) 0.083% IN NEBU: 2.5000 mg | INHALATION_SOLUTION | Freq: Four times a day (QID) | RESPIRATORY_TRACT | Status: DC | PRN

## 2021-09-02 MED ORDER — ONDANSETRON HCL 4 MG/2ML IJ SOLN
INTRAMUSCULAR | Status: AC
  Filled 2021-09-02: qty 2

## 2021-09-02 MED ORDER — HYDROMORPHONE HCL 1 MG/ML IJ SOLN
0.5000 mg | INTRAMUSCULAR | Status: DC | PRN
  Administered 2021-09-02: 1 mg via INTRAVENOUS
  Filled 2021-09-02 (×3): qty 1

## 2021-09-02 MED ORDER — PROPOFOL 10 MG/ML IV BOLUS
INTRAVENOUS | Status: AC
  Filled 2021-09-02: qty 20

## 2021-09-02 MED ORDER — MIDAZOLAM HCL 2 MG/2ML IJ SOLN
INTRAMUSCULAR | Status: AC
  Filled 2021-09-02: qty 2

## 2021-09-02 MED ORDER — ZINC SULFATE 220 (50 ZN) MG PO CAPS
220.0000 mg | ORAL_CAPSULE | Freq: Every day | ORAL | Status: DC
  Administered 2021-09-03: 220 mg via ORAL
  Filled 2021-09-02: qty 1

## 2021-09-02 MED ORDER — PROPOFOL 500 MG/50ML IV EMUL
INTRAVENOUS | Status: DC | PRN
  Administered 2021-09-02: 40 ug/kg/min via INTRAVENOUS

## 2021-09-02 MED ORDER — METHOCARBAMOL 500 MG IVPB - SIMPLE MED
INTRAVENOUS | Status: AC
  Filled 2021-09-02: qty 50

## 2021-09-02 MED ORDER — DICYCLOMINE HCL 10 MG PO CAPS: 10.0000 mg | ORAL_CAPSULE | Freq: Four times a day (QID) | ORAL | Status: DC | PRN

## 2021-09-02 MED ORDER — ADULT MULTIVITAMIN W/MINERALS CH
1.0000 | ORAL_TABLET | Freq: Every day | ORAL | Status: DC
  Administered 2021-09-03: 1 via ORAL
  Filled 2021-09-02: qty 1

## 2021-09-02 MED ORDER — BUPIVACAINE-EPINEPHRINE (PF) 0.25% -1:200000 IJ SOLN
INTRAMUSCULAR | Status: AC
  Filled 2021-09-02: qty 30

## 2021-09-02 MED ORDER — HYDROMORPHONE HCL 1 MG/ML IJ SOLN
0.2500 mg | INTRAMUSCULAR | Status: DC | PRN
  Administered 2021-09-02 (×4): 0.5 mg via INTRAVENOUS

## 2021-09-02 MED ORDER — MENTHOL 3 MG MT LOZG: 1.0000 | LOZENGE | OROMUCOSAL | Status: DC | PRN

## 2021-09-02 MED ORDER — SODIUM CHLORIDE (PF) 0.9 % IJ SOLN
INTRAMUSCULAR | Status: DC | PRN
  Administered 2021-09-02: 30 mL via INTRAVENOUS

## 2021-09-02 MED ORDER — SODIUM CHLORIDE 0.9 % IR SOLN
Status: DC | PRN
  Administered 2021-09-02: 3000 mL

## 2021-09-02 MED ORDER — ONDANSETRON HCL 4 MG PO TABS: 4.0000 mg | ORAL_TABLET | Freq: Four times a day (QID) | ORAL | Status: DC | PRN

## 2021-09-02 MED ORDER — APIXABAN 2.5 MG PO TABS
2.5000 mg | ORAL_TABLET | Freq: Two times a day (BID) | ORAL | Status: DC
  Administered 2021-09-03: 2.5 mg via ORAL
  Filled 2021-09-02: qty 1

## 2021-09-02 MED ORDER — FENTANYL CITRATE (PF) 100 MCG/2ML IJ SOLN
INTRAMUSCULAR | Status: DC | PRN
  Administered 2021-09-02 (×2): 25 ug via INTRAVENOUS
  Administered 2021-09-02: 50 ug via INTRAVENOUS

## 2021-09-02 MED ORDER — DOCUSATE SODIUM 100 MG PO CAPS
100.0000 mg | ORAL_CAPSULE | Freq: Two times a day (BID) | ORAL | Status: DC
  Administered 2021-09-02 – 2021-09-03 (×2): 100 mg via ORAL
  Filled 2021-09-02 (×2): qty 1

## 2021-09-02 MED ORDER — BUPROPION HCL ER (XL) 150 MG PO TB24
150.0000 mg | ORAL_TABLET | Freq: Every day | ORAL | Status: DC
  Administered 2021-09-02 – 2021-09-03 (×2): 150 mg via ORAL
  Filled 2021-09-02 (×2): qty 1

## 2021-09-02 MED ORDER — ISOPROPYL ALCOHOL 70 % SOLN
Status: DC | PRN
Start: 2021-09-02 — End: 2021-09-02
  Administered 2021-09-02: 1 via TOPICAL

## 2021-09-02 MED ORDER — MIDAZOLAM HCL 2 MG/2ML IJ SOLN
INTRAMUSCULAR | Status: DC | PRN
  Administered 2021-09-02 (×2): 1 mg via INTRAVENOUS

## 2021-09-02 MED ORDER — STERILE WATER FOR IRRIGATION IR SOLN
Status: DC | PRN
  Administered 2021-09-02: 1000 mL

## 2021-09-02 SURGICAL SUPPLY — 68 items
ADH SKN CLS APL DERMABOND .7 (GAUZE/BANDAGES/DRESSINGS) ×1
APL PRP STRL LF DISP 70% ISPRP (MISCELLANEOUS) ×2
BAG COUNTER SPONGE SURGICOUNT (BAG) IMPLANT
BAG SPEC THK2 15X12 ZIP CLS (MISCELLANEOUS)
BAG SPNG CNTER NS LX DISP (BAG)
BAG ZIPLOCK 12X15 (MISCELLANEOUS) IMPLANT
BATTERY INSTRU NAVIGATION (MISCELLANEOUS) ×9 IMPLANT
BLADE SAW RECIPROCATING 77.5 (BLADE) ×3 IMPLANT
BNDG COHESIVE 4X5 TAN NS LF (GAUZE/BANDAGES/DRESSINGS) ×1 IMPLANT
BNDG ELASTIC 4X5.8 VLCR STR LF (GAUZE/BANDAGES/DRESSINGS) ×3 IMPLANT
BNDG ELASTIC 6X5.8 VLCR STR LF (GAUZE/BANDAGES/DRESSINGS) ×3 IMPLANT
BTRY SRG DRVR LF (MISCELLANEOUS) ×3
CHLORAPREP W/TINT 26 (MISCELLANEOUS) ×6 IMPLANT
COMP FEM KNEE STD PS 7 LT (Joint) ×2 IMPLANT
COMPONENT FEM KNEE STD PS 7 LT (Joint) IMPLANT
COVER SURGICAL LIGHT HANDLE (MISCELLANEOUS) ×3 IMPLANT
DERMABOND ADVANCED (GAUZE/BANDAGES/DRESSINGS) ×1
DERMABOND ADVANCED .7 DNX12 (GAUZE/BANDAGES/DRESSINGS) ×4 IMPLANT
DRAPE INCISE IOBAN 66X45 STRL (DRAPES) ×3 IMPLANT
DRAPE SHEET LG 3/4 BI-LAMINATE (DRAPES) ×9 IMPLANT
DRAPE U-SHAPE 47X51 STRL (DRAPES) ×3 IMPLANT
DRSG AQUACEL AG ADV 3.5X14 (GAUZE/BANDAGES/DRESSINGS) ×3 IMPLANT
ELECT BLADE TIP CTD 4 INCH (ELECTRODE) ×3 IMPLANT
ELECT REM PT RETURN 15FT ADLT (MISCELLANEOUS) ×3 IMPLANT
GAUZE SPONGE 4X4 12PLY STRL (GAUZE/BANDAGES/DRESSINGS) ×3 IMPLANT
GLOVE SURG ENC MOIS LTX SZ8.5 (GLOVE) ×6 IMPLANT
GLOVE SURG UNDER POLY LF SZ8.5 (GLOVE) ×3 IMPLANT
GOWN SPEC L3 XXLG W/TWL (GOWN DISPOSABLE) ×6 IMPLANT
HANDPIECE INTERPULSE COAX TIP (DISPOSABLE) ×2
HDLS TROCR DRIL PIN KNEE 75 (PIN) ×8
HOLDER FOLEY CATH W/STRAP (MISCELLANEOUS) ×3 IMPLANT
HOOD PEEL AWAY FLYTE STAYCOOL (MISCELLANEOUS) ×9 IMPLANT
IMPL PATELLA METAL SZ32X10 (Joint) ×1 IMPLANT
INSERT TIB ASF EF/3-11 10 LT (Insert) ×1 IMPLANT
KIT TURNOVER KIT A (KITS) IMPLANT
MARKER SKIN DUAL TIP RULER LAB (MISCELLANEOUS) ×3 IMPLANT
NDL SAFETY ECLIPSE 18X1.5 (NEEDLE) ×2 IMPLANT
NDL SPNL 18GX3.5 QUINCKE PK (NEEDLE) ×2 IMPLANT
NEEDLE HYPO 18GX1.5 SHARP (NEEDLE) ×2
NEEDLE SPNL 18GX3.5 QUINCKE PK (NEEDLE) ×2 IMPLANT
NS IRRIG 1000ML POUR BTL (IV SOLUTION) ×3 IMPLANT
PACK TOTAL KNEE CUSTOM (KITS) ×3 IMPLANT
PADDING CAST COTTON 6X4 STRL (CAST SUPPLIES) ×3 IMPLANT
PIN DRILL HDLS TROCAR 75 4PK (PIN) IMPLANT
PROTECTOR NERVE ULNAR (MISCELLANEOUS) ×3 IMPLANT
SAW OSC TIP CART 19.5X105X1.3 (SAW) ×3 IMPLANT
SCREW FEMALE HEX FIX 25X2.5 (ORTHOPEDIC DISPOSABLE SUPPLIES) ×1 IMPLANT
SEALER BIPOLAR AQUA 6.0 (INSTRUMENTS) ×3 IMPLANT
SET HNDPC FAN SPRY TIP SCT (DISPOSABLE) ×2 IMPLANT
SET PAD KNEE POSITIONER (MISCELLANEOUS) ×3 IMPLANT
SOLUTION PRONTOSAN WOUND 350ML (IRRIGATION / IRRIGATOR) IMPLANT
SPIKE FLUID TRANSFER (MISCELLANEOUS) ×6 IMPLANT
SPONGE T-LAP 18X18 ~~LOC~~+RFID (SPONGE) ×9 IMPLANT
STEM TIB PERS SZ E 5D LT (Screw) ×1 IMPLANT
SUT MNCRL AB 3-0 PS2 18 (SUTURE) ×3 IMPLANT
SUT MNCRL AB 4-0 PS2 18 (SUTURE) ×3 IMPLANT
SUT MON AB 2-0 CT1 36 (SUTURE) ×3 IMPLANT
SUT STRATAFIX PDO 1 14 VIOLET (SUTURE) ×2
SUT STRATFX PDO 1 14 VIOLET (SUTURE) ×1
SUT VIC AB 1 CTX 36 (SUTURE) ×4
SUT VIC AB 1 CTX36XBRD ANBCTR (SUTURE) ×4 IMPLANT
SUT VIC AB 2-0 CT1 27 (SUTURE) ×2
SUT VIC AB 2-0 CT1 TAPERPNT 27 (SUTURE) ×2 IMPLANT
SUTURE STRATFX PDO 1 14 VIOLET (SUTURE) ×2 IMPLANT
TRAY FOLEY MTR SLVR 16FR STAT (SET/KITS/TRAYS/PACK) IMPLANT
TUBE SUCTION HIGH CAP CLEAR NV (SUCTIONS) ×3 IMPLANT
The personalized knee systemCruciate retaining (Orthopedic Implant) ×1 IMPLANT
WATER STERILE IRR 1000ML POUR (IV SOLUTION) ×6 IMPLANT

## 2021-09-02 NOTE — Anesthesia Procedure Notes (Signed)
Procedure Name: [place] ?Date/Time: 09/02/2021 11:20 AM ?Performed by: Eben Burow, CRNA ?Pre-anesthesia Checklist: Patient identified, Emergency Drugs available, Suction available, Patient being monitored and Timeout performed ?Oxygen Delivery Method: Simple face mask ?Placement Confirmation: positive ETCO2 and breath sounds checked- equal and bilateral ?Dental Injury: Teeth and Oropharynx as per pre-operative assessment  ? ? ? ? ?

## 2021-09-02 NOTE — Evaluation (Signed)
Physical Therapy Evaluation ?Patient Details ?Name: Michelle Hill ?MRN: [id number] ?DOB: Dec 15, 1980 ?Today's Date: 09/02/2021 ? ?History of Present Illness ? Pt is a 41yo female presenting s/p L-TKA on 09/02/21. PMH: anemia, crohn's disease with colostomy.  ?Clinical Impression ? ROSIA SYME is a 41 y.o. female POD 0 s/p L-TKA. Patient reports independence with mobility at baseline. Patient is now limited by functional impairments (see PT problem list below) and requires min assist for bed mobility and mod assist for transfers. Ambulation deferred to next session. Patient instructed in exercise to facilitate ROM and circulation to manage edema. Patient will benefit from continued skilled PT interventions to address impairments and progress towards PLOF. Acute PT will follow to progress mobility in preparation for safe discharge home. ?   ?   ? ?Recommendations for follow up therapy are one component of a multi-disciplinary discharge planning process, led by the attending physician.  Recommendations may be updated based on patient status, additional functional criteria and insurance authorization. ? ?Follow Up Recommendations Follow physician's recommendations for discharge plan and follow up therapies ? ?  ?Assistance Recommended at Discharge Set up Supervision/Assistance  ?Patient can return home with the following ? A little help with walking and/or transfers;A little help with bathing/dressing/bathroom;Assistance with cooking/housework;Assist for transportation;Help with stairs or ramp for entrance ? ?  ?Equipment Recommendations None recommended by PT (pt has recommended DME)  ?Recommendations for Other Services ?    ?  ?Functional Status Assessment Patient has had a recent decline in their functional status and demonstrates the ability to make significant improvements in function in a reasonable and predictable amount of time.  ? ?  ?Precautions / Restrictions Precautions ?Precautions: Fall ?Precaution  Comments: Pt reports fall in october 2022 ?Restrictions ?Weight Bearing Restrictions: Yes ?LLE Weight Bearing: Weight bearing as tolerated  ? ?  ? ?Mobility ? Bed Mobility ?Overal bed mobility: Needs Assistance ?Bed Mobility: Supine to Sit ?  ?  ?Supine to sit: Min assist ?  ?  ?General bed mobility comments: Pt min assist for LLE advancement off bed, moderate VC for sequencing. ?  ? ?Transfers ?Overall transfer level: Needs assistance ?Equipment used: Rolling walker (2 wheels) ?Transfers: Sit to/from Stand, Bed to chair/wheelchair/BSC ?Sit to Stand: Mod assist ?  ?Step pivot transfers: Min guard ?  ?  ?  ?General transfer comment: Pt required mod assist for steadying RW and providing lift assist during standing, moderate multimodal cues for sequencing and hand placement during transfers. Pt min guard for step-pivot transfer with VCs for LE sequencing. ?  ? ?Ambulation/Gait ?  ?  ?  ?  ?  ?  ?  ?General Gait Details: deferred ? ?Stairs ?  ?  ?  ?  ?  ? ?Wheelchair Mobility ?  ? ?Modified Rankin (Stroke Patients Only) ?  ? ?  ? ?Balance Overall balance assessment: Needs assistance ?Sitting-balance support: Feet supported, No upper extremity supported ?Sitting balance-Leahy Scale: Fair ?  ?  ?Standing balance support: Bilateral upper extremity supported, During functional activity, Reliant on assistive device for balance ?Standing balance-Leahy Scale: Poor ?  ?  ?  ?  ?  ?  ?  ?  ?  ?  ?  ?  ?   ? ? ? ?Pertinent Vitals/Pain Pain Assessment ?Pain Assessment: 0-10 ?Pain Score: 7  ?Pain Location: L knee ?Pain Descriptors / Indicators: Operative site guarding, Cramping, Discomfort, Grimacing ?Pain Intervention(s): Ice applied, Monitored during session, Repositioned  ? ? ?Home Living Family/patient expects to  be discharged to:: Private residence ?Living Arrangements: Alone ?Available Help at Discharge: Family;Available 24 hours/day (Mom will be staying with) ?Type of Home: Apartment ?Home Access: Level entry ?  ?  ?  ?Home  Layout: One level ?Home Equipment: Conservation officer, nature (2 wheels);Shower seat;Toilet riser ?   ?  ?Prior Function Prior Level of Function : Independent/Modified Independent;Driving;History of Falls (last six months) ?  ?  ?  ?  ?  ?  ?Mobility Comments: IND (fell and fractured L knee in october 2022) ?ADLs Comments: IND ?  ? ? ?Hand Dominance  ? Dominant Hand: Right ? ?  ?Extremity/Trunk Assessment  ? Upper Extremity Assessment ?Upper Extremity Assessment: Overall WFL for tasks assessed ?  ? ?Lower Extremity Assessment ?Lower Extremity Assessment: RLE deficits/detail;LLE deficits/detail ?RLE Deficits / Details: MMT ank df/pf 4/5 ?RLE Sensation: WNL ?LLE Deficits / Details: MMT ank df/pf 4/5, no extensor lag noted ?LLE Sensation: WNL ?  ? ?Cervical / Trunk Assessment ?Cervical / Trunk Assessment: Other exceptions ?Cervical / Trunk Exceptions: colostomy on RLQ  ?Communication  ? Communication: No difficulties  ?Cognition Arousal/Alertness: Awake/alert ?Behavior During Therapy: [facility] for tasks assessed/performed ?Overall Cognitive Status: Within Functional Limits for tasks assessed ?  ?  ?  ?  ?  ?  ?  ?  ?  ?  ?  ?  ?  ?  ?  ?  ?  ?  ?  ? ?  ?General Comments   ? ?  ?Exercises Total Joint Exercises ?Ankle Circles/Pumps: AROM, Both, 20 reps ?Quad Sets: AROM, Left, 5 reps  ? ?Assessment/Plan  ?  ?PT Assessment Patient needs continued PT services  ?PT Problem List Decreased strength;Decreased range of motion;Decreased activity tolerance;Decreased balance;Decreased mobility;Decreased coordination;Decreased knowledge of use of DME;Pain ? ?   ?  ?PT Treatment Interventions DME instruction;Gait training;Stair training;Functional mobility training;Therapeutic activities;Therapeutic exercise;Balance training;Neuromuscular re-education;Patient/family education   ? ?PT Goals (Current goals can be found in the Care Plan section)  ?Acute Rehab PT Goals ?Patient Stated Goal: to go home ?PT Goal Formulation: With patient ?Time For Goal  Achievement: 09/09/21 ?Potential to Achieve Goals: Good ? ?  ?Frequency 7X/week ?  ? ? ?Co-evaluation   ?  ?  ?  ?  ? ? ?  ?AM-PAC PT "6 Clicks" Mobility  ?Outcome Measure Help needed turning from your back to your side while in a flat bed without using bedrails?: A Little ?Help needed moving from lying on your back to sitting on the side of a flat bed without using bedrails?: A Little ?Help needed moving to and from a bed to a chair (including a wheelchair)?: A Little ?Help needed standing up from a chair using your arms (e.g., wheelchair or bedside chair)?: A Little ?Help needed to walk in hospital room?: A Little ?Help needed climbing 3-5 steps with a railing? : A Little ?6 Click Score: 18 ? ?  ?End of Session Equipment Utilized During Treatment: Gait belt ?Activity Tolerance: Patient tolerated treatment well ?Patient left: in chair;with chair alarm set;with call bell/phone within reach;with family/visitor present ?Nurse Communication: Mobility status ?PT Visit Diagnosis: Pain;Difficulty in walking, not elsewhere classified (R26.2) ?Pain - Right/Left: Left ?Pain - part of body: Knee ?  ? ?Time: 6063-0160 ?PT Time Calculation (min) (ACUTE ONLY): 29 min ? ? ?Charges:   PT Evaluation ?$PT Eval Low Complexity: 1 Low ?PT Treatments ?$Therapeutic Activity: 8-22 mins ?  ?   ? ? ?Coolidge Breeze, PT, DPT ?[facility] ?Office: [phone] ?Pager: [phone] ? ? ?  Coolidge Breeze ?09/02/2021, 7:18 PM ? ?

## 2021-09-02 NOTE — Anesthesia Procedure Notes (Signed)
Anesthesia Regional Block: Adductor canal block  ? ?Pre-Anesthetic Checklist: , timeout performed,  Correct Patient, Correct Site, Correct Laterality,  Correct Procedure, Correct Position, site marked,  Risks and benefits discussed,  Pre-op evaluation,  At surgeon's request and post-op pain management ? ?Laterality: Left ? ?Prep: Maximum Sterile Barrier Precautions used, chloraprep     ?  ?Needles:  ?Injection technique: Single-shot ? ?Needle Type: Echogenic Stimulator Needle   ? ? ?Needle Length: 9cm  ?Needle Gauge: 21  ? ? ? ?Additional Needles: ? ? ?Procedures:,,,, ultrasound used (permanent image in chart),,    ?Narrative:  ?Start time: 09/02/2021 9:20 AM ?End time: 09/02/2021 9:30 AM ?Injection made incrementally with aspirations every 5 mL. ? ?Performed by: Personally  ?Anesthesiologist: Roderic Palau, MD ? ? ? ? ?

## 2021-09-02 NOTE — Progress Notes (Signed)
Assisted Dr. Roderic Palau  with  left Knee Adductor Canal block. Side rails up, monitors on throughout procedure. See vital signs in flow sheet. Tolerated Procedure well. ? ?

## 2021-09-02 NOTE — Interval H&P Note (Signed)
History and Physical Interval Note: ? ?09/02/2021 ?10:08 AM ? ?Michelle Hill  has presented today for surgery, with the diagnosis of Left knee avascular necrosis.  The various methods of treatment have been discussed with the patient and family. After consideration of risks, benefits and other options for treatment, the patient has consented to  Procedure(s) with comments: ?COMPUTER ASSISTED TOTAL KNEE ARTHROPLASTY (Left) - 150 as a surgical intervention.  The patient's history has been reviewed, patient examined, no change in status, stable for surgery.  I have reviewed the patient's chart and labs.  Questions were answered to the patient's satisfaction.   ? ? ?Hilton Cork Argusta Mcgann ? ? ?

## 2021-09-02 NOTE — Discharge Instructions (Addendum)
 Dr. Brian Swinteck Total Joint Specialist [facility] [address] [place], [place] [address] [phone]  TOTAL KNEE REPLACEMENT POSTOPERATIVE DIRECTIONS    Knee Rehabilitation, Guidelines Following Surgery  Results after knee surgery are often greatly improved when you follow the exercise, range of motion and muscle strengthening exercises prescribed by your doctor. Safety measures are also important to protect the knee from further injury. Any time any of these exercises cause you to have increased pain or swelling in your knee joint, decrease the amount until you are comfortable again and slowly increase them. If you have problems or questions, call your caregiver or physical therapist for advice.   WEIGHT BEARING Weight bearing as tolerated with assist device (walker, cane, etc) as directed, use it as long as suggested by your surgeon or therapist, typically at least 4-6 weeks.  HOME CARE INSTRUCTIONS  Remove items at home which could result in a fall. This includes throw rugs or furniture in walking pathways.  Continue medications as instructed at time of discharge. You may have some home medications which will be placed on hold until you complete the course of blood thinner medication.  You may start showering once you are discharged home but do not submerge the incision under water. Just pat the incision dry and apply a dry gauze dressing on daily. Walk with walker as instructed.  You may resume a sexual relationship in one month or when given the OK by your doctor.  Use walker as long as suggested by your caregivers. Avoid periods of inactivity such as sitting longer than an hour when not asleep. This helps prevent blood clots.  You may put full weight on your legs and walk as much as is comfortable.  You may return to work once you are cleared by your doctor.  Do not drive a car for 6 weeks or until released by you surgeon.  Do not drive while  taking narcotics.  Wear the elastic stockings for three weeks following surgery during the day but you may remove then at night. Make sure you keep all of your appointments after your operation with all of your doctors and caregivers. You should call the office at the above phone number and make an appointment for approximately two weeks after the date of your surgery. Do not remove your surgical dressing. The dressing is waterproof; you may take showers in 3 days, but do not take tub baths or submerge the dressing. Please pick up a stool softener and laxative for home use as long as you are requiring pain medications. ICE to the affected knee every three hours for 30 minutes at a time and then as needed for pain and swelling.  Continue to use ice on the knee for pain and swelling from surgery. You may notice swelling that will progress down to the foot and ankle.  This is normal after surgery.  Elevate the leg when you are not up walking on it.   It is important for you to complete the blood thinner medication as prescribed by your doctor. Continue to use the breathing machine which will help keep your temperature down.  It is common for your temperature to cycle up and down following surgery, especially at night when you are not up moving around and exerting yourself.  The breathing machine keeps your lungs expanded and your temperature down.  RANGE OF MOTION AND STRENGTHENING EXERCISES  Rehabilitation of the knee is important following a knee injury or an   operation. After just a few days of immobilization, the muscles of the thigh which control the knee become weakened and shrink (atrophy). Knee exercises are designed to build up the tone and strength of the thigh muscles and to improve knee motion. Often times heat used for twenty to thirty minutes before working out will loosen up your tissues and help with improving the range of motion but do not use heat for the first two weeks following surgery.  These exercises can be done on a training (exercise) mat, on the floor, on a table or on a bed. Use what ever works the best and is most comfortable for you Knee exercises include:  Leg Lifts - While your knee is still immobilized in a splint or cast, you can do straight leg raises. Lift the leg to 60 degrees, hold for 3 sec, and slowly lower the leg. Repeat 10-20 times 2-3 times daily. Perform this exercise against resistance later as your knee gets better.  Quad and Hamstring Sets - Tighten up the muscle on the front of the thigh (Quad) and hold for 5-10 sec. Repeat this 10-20 times hourly. Hamstring sets are done by pushing the foot backward against an object and holding for 5-10 sec. Repeat as with quad sets.  A rehabilitation program following serious knee injuries can speed recovery and prevent re-injury in the future due to weakened muscles. Contact your doctor or a physical therapist for more information on knee rehabilitation.   POST-OPERATIVE OPIOID TAPER INSTRUCTIONS: It is important to wean off of your opioid medication as soon as possible. If you do not need pain medication after your surgery it is ok to stop day one. Opioids include: Codeine, Hydrocodone(Norco, Vicodin), Oxycodone(Percocet, oxycontin) and hydromorphone amongst others.  Long term and even short term use of opiods can cause: Increased pain response Dependence Constipation Depression Respiratory depression And more.  Withdrawal symptoms can include Flu like symptoms Nausea, vomiting And more Techniques to manage these symptoms Hydrate well Eat regular healthy meals Stay active Use relaxation techniques(deep breathing, meditating, yoga) Do Not substitute Alcohol to help with tapering If you have been on opioids for less than two weeks and do not have pain than it is ok to stop all together.  Plan to wean off of opioids This plan should start within one week post op of your joint replacement. Maintain the same  interval or time between taking each dose and first decrease the dose.  Cut the total daily intake of opioids by one tablet each day Next start to increase the time between doses. The last dose that should be eliminated is the evening dose.    SKILLED REHAB INSTRUCTIONS: If the patient is transferred to a skilled rehab facility following release from the hospital, a list of the current medications will be sent to the facility for the patient to continue.  When discharged from the skilled rehab facility, please have the facility set up the patient's Home Health Physical Therapy prior to being released. Also, the skilled facility will be responsible for providing the patient with their medications at time of release from the facility to include their pain medication, the muscle relaxants, and their blood thinner medication. If the patient is still at the rehab facility at time of the two week follow up appointment, the skilled rehab facility will also need to assist the patient in arranging follow up appointment in our office and any transportation needs.  MAKE SURE YOU:  Understand these instructions.  Will watch   your condition.  ?Will get help right away if you are not doing well or get worse.  ? ? ?Pick up stool softner and laxative for home use following surgery while on pain medications. ?Do NOT remove your dressing. You may shower.  ?Do not take tub baths or submerge incision under water. ?May shower starting three days after surgery. ?Please use a clean towel to pat the incision dry following showers. ?Continue to use ice for pain and swelling after surgery. ?Do not use any lotions or creams on the incision until instructed by your surgeon. ? ?Information on my medicine - ELIQUIS? (apixaban) ? ?Why was Eliquis? prescribed for you? ?Eliquis? was prescribed for you to reduce the risk of blood clots forming after orthopedic surgery.   ? ?What do You need to know about Eliquis?? ?Take your Eliquis? TWICE  DAILY - one tablet in the morning and one tablet in the evening with or without food.  It would be best to take the dose about the same time each day. ? ?If you have difficulty swallowing the tablet whole pl

## 2021-09-02 NOTE — Anesthesia Postprocedure Evaluation (Signed)
Anesthesia Post Note ? ?Patient: Michelle Hill ? ?Procedure(s) Performed: COMPUTER ASSISTED TOTAL KNEE ARTHROPLASTY (Left: Knee) ? ?  ? ?Patient location during evaluation: PACU ?Anesthesia Type: Spinal ?Level of consciousness: oriented and awake and alert ?Pain management: pain level controlled ?Vital Signs Assessment: post-procedure vital signs reviewed and stable ?Respiratory status: spontaneous breathing, respiratory function stable and nonlabored ventilation ?Cardiovascular status: blood pressure returned to baseline and stable ?Postop Assessment: no headache, no backache, no apparent nausea or vomiting and spinal receding ?Anesthetic complications: no ? ? ?No notable events documented. ? ?Last Vitals:  ?Vitals:  ? 09/02/21 1515 09/02/21 1530  ?BP: 103/71 101/64  ?Pulse: 70 73  ?Resp: 16 12  ?Temp:    ?SpO2: 100% 100%  ?  ?Last Pain:  ?Vitals:  ? 09/02/21 1515  ?TempSrc:   ?PainSc: 4   ? ? ?  ?  ?  ?  ?  ?  ? ?Lidia Collum ? ? ? ? ?

## 2021-09-02 NOTE — Plan of Care (Signed)
  Problem: Activity: Goal: Risk for activity intolerance will decrease Outcome: Progressing   Problem: Pain Managment: Goal: General experience of comfort will improve Outcome: Progressing   Problem: Safety: Goal: Ability to remain free from injury will improve Outcome: Progressing   

## 2021-09-02 NOTE — Anesthesia Procedure Notes (Signed)
Spinal ? ?Patient location during procedure: OR ?Start time: 09/02/2021 11:13 AM ?End time: 09/02/2021 11:16 AM ?Reason for block: surgical anesthesia ?Staffing ?Performed: anesthesiologist  ?Anesthesiologist: Roderic Palau, MD ?Preanesthetic Checklist ?Completed: patient identified, IV checked, risks and benefits discussed, surgical consent, monitors and equipment checked, pre-op evaluation and timeout performed ?Spinal Block ?Patient position: sitting ?Prep: DuraPrep ?Patient monitoring: cardiac monitor, continuous pulse ox and blood pressure ?Approach: midline ?Location: L3-4 ?Injection technique: single-shot ?Needle ?Needle type: Pencan  ?Needle gauge: 24 G ?Needle length: 9 cm ?Assessment ?Sensory level: T8 ?Events: CSF return and second provider ?Additional Notes ?Functioning IV was confirmed and monitors were applied. Sterile prep and drape, including hand hygiene and sterile gloves were used. The patient was positioned and the spine was prepped. The skin was anesthetized with lidocaine.  Free flow of clear CSF was obtained prior to injecting local anesthetic into the CSF.  The spinal needle aspirated freely following injection.  The needle was carefully withdrawn.  The patient tolerated the procedure well. CRNA attempted at L2-3 prior to my attempt. ? ? ? ?

## 2021-09-02 NOTE — Anesthesia Preprocedure Evaluation (Addendum)
Anesthesia Evaluation  ?Patient identified by MRN, date of birth, ID band ?Patient awake ? ? ? ?Reviewed: ?Allergy & Precautions, H&P , NPO status , Patient's Chart, lab work & pertinent test results ? ?Airway ?Mallampati: III ? ?TM Distance: >3 FB ?Neck ROM: Full ? ? ? Dental ?no notable dental hx. ?(+) Teeth Intact, Dental Advisory Given ?  ?Pulmonary ?neg pulmonary ROS, former smoker,  ?  ?Pulmonary exam normal ?breath sounds clear to auscultation ? ? ? ? ? ? Cardiovascular ?negative cardio ROS ? ? ?Rhythm:Regular Rate:Normal ? ? ?  ?Neuro/Psych ?Anxiety Depression negative neurological ROS ?   ? GI/Hepatic ?negative GI ROS, Neg liver ROS,   ?Endo/Other  ?negative endocrine ROS ? Renal/GU ?negative Renal ROS  ?negative genitourinary ?  ?Musculoskeletal ? ? Abdominal ?  ?Peds ? Hematology ? ?(+) Blood dyscrasia, anemia ,   ?Anesthesia Other Findings ? ? Reproductive/Obstetrics ?negative OB ROS ? ?  ? ? ? ? ? ? ? ? ? ? ? ? ? ?  ?  ? ? ? ? ? ? ? ?Anesthesia Physical ?Anesthesia Plan ? ?ASA: 2 ? ?Anesthesia Plan: Spinal  ? ?Post-op Pain Management: Regional block* and Tylenol PO (pre-op)*  ? ?Induction: Intravenous ? ?PONV Risk Score and Plan: 3 and Dexamethasone, Propofol infusion and Midazolam ? ?Airway Management Planned: Natural Airway and Simple Face Mask ? ?Additional Equipment:  ? ?Intra-op Plan:  ? ?Post-operative Plan:  ? ?Informed Consent: I have reviewed the patients History and Physical, chart, labs and discussed the procedure including the risks, benefits and alternatives for the proposed anesthesia with the patient or authorized representative who has indicated his/her understanding and acceptance.  ? ? ? ?Dental advisory given ? ?Plan Discussed with: CRNA ? ?Anesthesia Plan Comments:   ? ? ? ? ? ? ?Anesthesia Quick Evaluation ? ?

## 2021-09-02 NOTE — Op Note (Signed)
OPERATIVE REPORT ? ?SURGEON: Rod Can, MD  ? ?ASSISTANT: Nehemiah Massed, PA-C ?Larene Pickett, PA-C ? ?PREOPERATIVE DIAGNOSIS: Osteonecrosis Left knee.  ? ?POSTOPERATIVE DIAGNOSIS: Osteonecrosis Left knee.  ? ?PROCEDURE: Computer assisted Left total knee arthroplasty.  ? ?IMPLANTS: Zimmer Persona PPS Cementless CR femur, size 7. ?Persona 0 degree Spiked Keel OsseoTi Tibia, size E. ?Vivacit-E polyethelyene insert, size 10 mm, CR. ?TM standard patella, size 32 mm. ? ?ANESTHESIA:  MAC, Regional, and Spinal ? ?TOURNIQUET TIME: Not utilized.  ? ?ESTIMATED BLOOD LOSS:-150 mL   ? ?ANTIBIOTICS: 2g Ancef. ? ?DRAINS: None. ? ?COMPLICATIONS: None ?  ?CONDITION: PACU - hemodynamically stable.  ? ?BRIEF CLINICAL NOTE: LACE CHENEVERT is a 41 y.o. 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 After failing conservative management, the patient was indicated for total knee arthroplasty. The risks, benefits, and alternatives to the procedure were explained, and the patient elected to proceed. ? ?PROCEDURE IN DETAIL: Adductor canal block was obtained in the pre-op holding area. Once inside the operative room, spinal anesthesia was obtained, and a foley catheter was inserted. The patient was then positioned and the lower extremity was prepped and draped in the normal sterile surgical fashion.  A time-out was called verifying side and site of surgery. The patient received IV antibiotics within 60 minutes of beginning the procedure. A tourniquet was not utilized. ?  ?An anterior approach to the knee was performed utilizing a midvastus arthrotomy. A medial release was performed and the patellar fat pad was excised. Stryker imageless navigation was used to cut the distal femur perpendicular to the mechanical axis. A freehand patellar resection was performed, and the patella was sized an prepared with 3 lug holes. ? ?Nagivation was used to make a neutral proximal  tibia resection, taking 10 mm of bone from the less affected lateral side with 3 degrees of slope. The proximal tibial bone was healthy. The menisci were excised. A spacer block was placed, and the alignment and balance in extension were confirmed.  ? ?The distal femur was sized using the 3-degree external rotation guide referencing the posterior femoral cortex. The appropriate 4-in-1 cutting block was pinned into place. Rotation was checked using Whiteside's line, the epicondylar axis, and then confirmed with a spacer block in flexion. The remaining femoral cuts were performed, taking care to protect the MCL. There was a small area of residual osteonecrosis on the posterior aspect of the distal medial cut. ? ?The tibia was sized and the trial tray was pinned into place. The remaining trail components were inserted. The knee was stable to varus and valgus stress through a full range of motion. The patella tracked centrally, and the PCL was well balanced. The trial components were removed, and the proximal tibial surface was prepared. Final components were impacted into place. The knee was tested for a final time and found to be well balanced. ?  ?The wound was copiously irrigated with Irrisept solution and normal saline using pule lavage.  Marcaine solution was injected into the periarticular soft tissue.  The wound was closed in layers using #1 Vicryl and Stratafix for the fascia, 2-0 Vicryl for the subcutaneous fat, 2-0 Monocryl for the deep dermal layer, 3-0 running Monocryl subcuticular Stitch, and 4-0 Monocryl stay sutures at both ends of the wound. Dermabond was applied to the skin.  Once the glue was fully dried, an Aquacell Ag and compressive dressing were applied.  The patient was transported to the recovery  room in stable condition.  Sponge, needle, and instrument counts were correct at the end of the case x2.  The patient tolerated the procedure well and there were no known complications. ? ?Please note  that a surgical assistant was a medical necessity for this procedure in order to perform it in a safe and expeditious manner. Surgical assistant was necessary to retract the ligaments and vital neurovascular structures to prevent injury to them and also necessary for proper positioning of the limb to allow for anatomic placement of the prosthesis. ?

## 2021-09-02 NOTE — Transfer of Care (Signed)
Immediate Anesthesia Transfer of Care Note ? ?Patient: Michelle Hill ? ?Procedure(s) Performed: COMPUTER ASSISTED TOTAL KNEE ARTHROPLASTY (Left: Knee) ? ?Patient Location: PACU ? ?Anesthesia Type:MAC, Regional and Spinal ? ?Level of Consciousness: awake, alert  and oriented ? ?Airway & Oxygen Therapy: Patient Spontanous Breathing ? ?Post-op Assessment: Report given to RN and Post -op Vital signs reviewed and stable ? ?Post vital signs: Reviewed and stable ? ?Last Vitals:  ?Vitals Value Taken Time  ?BP 98/63 09/02/21 1432  ?Temp    ?Pulse 103 09/02/21 1434  ?Resp 16 09/02/21 1434  ?SpO2 96 % 09/02/21 1434  ?Vitals shown include unvalidated device data. ? ?Last Pain:  ?Vitals:  ? 09/02/21 0818  ?TempSrc:   ?PainSc: 7   ?   ? ?Patients Stated Pain Goal: 6 (09/02/21 0818) ? ?Complications: No notable events documented. ?

## 2021-09-03 DIAGNOSIS — M1712 Unilateral primary osteoarthritis, left knee: Secondary | ICD-10-CM | POA: Diagnosis not present

## 2021-09-03 DIAGNOSIS — M879 Osteonecrosis, unspecified: Secondary | ICD-10-CM | POA: Diagnosis not present

## 2021-09-03 LAB — BASIC METABOLIC PANEL
Anion gap: 6 (ref 5–15)
BUN: 13 mg/dL (ref 6–20)
CO2: 20 mmol/L — ABNORMAL LOW (ref 22–32)
Calcium: 8.5 mg/dL — ABNORMAL LOW (ref 8.9–10.3)
Chloride: 109 mmol/L (ref 98–111)
Creatinine, Ser: 0.65 mg/dL (ref 0.44–1.00)
GFR, Estimated: >60 mL/min (ref >60)
Glucose, Bld: 147 mg/dL — ABNORMAL HIGH (ref 70–99)
Potassium: 4.2 mmol/L (ref 3.5–5.1)
Sodium: 135 mmol/L (ref 135–145)

## 2021-09-03 LAB — CBC
HCT: 29.8 % — ABNORMAL LOW (ref 36.0–46.0)
Hemoglobin: 10 g/dL — ABNORMAL LOW (ref 12.0–15.0)
MCH: 33 pg (ref 26.0–34.0)
MCHC: 33.6 g/dL (ref 30.0–36.0)
MCV: 98.3 fL (ref 80.0–100.0)
Platelets: 260 10*3/uL (ref 150–400)
RBC: 3.03 MIL/uL — ABNORMAL LOW (ref 3.87–5.11)
RDW: 13.6 % (ref 11.5–15.5)
WBC: 17.3 10*3/uL — ABNORMAL HIGH (ref 4.0–10.5)
nRBC: 0 % (ref 0.0–0.2)

## 2021-09-03 MED ORDER — DOCUSATE SODIUM 100 MG PO CAPS
100.0000 mg | ORAL_CAPSULE | Freq: Two times a day (BID) | ORAL | 0 refills | Status: AC
Start: 2021-09-03 — End: 2021-10-03

## 2021-09-03 MED ORDER — SENNA 8.6 MG PO TABS: 2.0000 | ORAL_TABLET | Freq: Every day | ORAL | 0 refills | Status: AC

## 2021-09-03 MED ORDER — ONDANSETRON HCL 4 MG PO TABS: 4.0000 mg | ORAL_TABLET | Freq: Four times a day (QID) | ORAL | 0 refills | Status: AC | PRN

## 2021-09-03 MED ORDER — POLYETHYLENE GLYCOL 3350 17 G PO PACK: 17.0000 g | PACK | Freq: Every day | ORAL | 0 refills | Status: AC | PRN

## 2021-09-03 MED ORDER — OXYCODONE HCL 15 MG PO TABS: 15.0000 mg | ORAL_TABLET | ORAL | 0 refills | Status: DC | PRN

## 2021-09-03 MED ORDER — APIXABAN 2.5 MG PO TABS: 2.5000 mg | ORAL_TABLET | Freq: Two times a day (BID) | ORAL | 0 refills | Status: AC

## 2021-09-03 NOTE — Discharge Summary (Signed)
Physician Discharge Summary  ?Patient ID: ?Hang Ammon Moors ?MRN: [id number] ?DOB/AGE: 43/06/1980 43 y.o. ? ?Admit date: 09/02/2021 ?Discharge date: 09/03/2021 ? ?Admission Diagnoses:  ?Knee osteonecrosis, left ([place]) ? ?Discharge Diagnoses:  ?Principal Problem: ?  Knee osteonecrosis, left ([place]) ? ? ?Past Medical History:  ?Diagnosis Date  ? Anemia   ? Chronic bronchitis ([place])   ? daily inhaler  ? Crohn's disease ([place])   ? Hidradenitis   ? Psoriasis   ? ? ?Surgeries: Procedure(s): ?COMPUTER ASSISTED TOTAL KNEE ARTHROPLASTY on 09/02/2021 ?  ?Consultants (if any):  ? ?Discharged Condition: Improved ? ?Hospital Course: LAVEYAH ORIOL is an 41 y.o. female who was admitted 09/02/2021 with a diagnosis of Knee osteonecrosis, left ([place]) and went to the operating room on 09/02/2021 and underwent the above named procedures.   ? ?She was given perioperative antibiotics:  ?Anti-infectives (From admission, onward)  ? ? Start     Dose/Rate Route Frequency Ordered Stop  ? 09/02/21 1730  ceFAZolin (ANCEF) IVPB 2g/100 mL premix       ? 2 g ?200 mL/hr over 30 Minutes Intravenous Every 6 hours 09/02/21 1620 09/03/21 1020  ? 09/02/21 0815  ceFAZolin (ANCEF) IVPB 2g/100 mL premix       ? 2 g ?200 mL/hr over 30 Minutes Intravenous On call to O.R. 09/02/21 0800 09/02/21 1151  ? ?  ? ? ?She was given sequential compression devices, early ambulation, and apixaban for DVT prophylaxis. ? ?She benefited maximally from the hospital stay and there were no complications.   ? ?Recent vital signs:  ?Vitals:  ? 09/03/21 1002 09/03/21 1351  ?BP: (!) 84/66 104/63  ?Pulse: 72 69  ?Resp: 20 20  ?Temp: 98 ?F (36.7 ?C) 98.5 ?F (36.9 ?C)  ?SpO2: 99% 100%  ? ? ?Recent laboratory studies:  ?Lab Results  ?Component Value Date  ? HGB 10.0 (L) 09/03/2021  ? HGB 13.0 08/26/2021  ? HGB 13.1 03/03/2016  ? ?Lab Results  ?Component Value Date  ? WBC 17.3 (H) 09/03/2021  ? PLT 260 09/03/2021  ? ?No results found for: INR ?Lab Results  ?Component Value Date  ? NA 135  09/03/2021  ? K 4.2 09/03/2021  ? CL 109 09/03/2021  ? CO2 20 (L) 09/03/2021  ? BUN 13 09/03/2021  ? CREATININE 0.65 09/03/2021  ? GLUCOSE 147 (H) 09/03/2021  ? ? ? ?Allergies as of 09/03/2021   ? ?   Reactions  ? Chocolate Anaphylaxis, Hives  ? Aspirin Other (See Comments)  ? Mouth and stomach irritation  ? Ibuprofen Other (See Comments)  ? gastritis  ? Escitalopram Oxalate   ? Drowsiness  ? Tramadol Palpitations  ? ?  ? ?  ?Medication List  ?  ? ?STOP taking these medications   ? ?doxycycline 100 MG tablet ?Commonly known as: VIBRA-TABS ?  ?Guselkumab 100 MG/ML Sopn ?  ? ?  ? ?TAKE these medications   ? ?acetaminophen 650 MG CR tablet ?Commonly known as: TYLENOL ?Take 1,300 mg by mouth every 8 (eight) hours as needed for pain. ?  ?albuterol 108 (90 Base) MCG/ACT inhaler ?Commonly known as: VENTOLIN HFA ?Inhale 1-2 puffs into the lungs every 6 (six) hours as needed for wheezing or shortness of breath. ?  ?apixaban 2.5 MG Tabs tablet ?Commonly known as: ELIQUIS ?Take 1 tablet (2.5 mg total) by mouth every 12 (twelve) hours. ?  ?atorvastatin 20 MG tablet ?Commonly known as: LIPITOR ?Take 20 mg by mouth at bedtime. ?  ?AZO CRANBERRY GUMMIES PO ?  Take 1 capsule by mouth 2 (two) times daily. ?  ?buPROPion 150 MG 24 hr tablet ?Commonly known as: WELLBUTRIN XL ?Take 150 mg by mouth daily. ?  ?CALTRATE 600+D PO ?Take 1 tablet by mouth daily. ?  ?cetirizine 10 MG tablet ?Commonly known as: ZYRTEC ?Take 10 mg by mouth daily as needed for allergies. ?  ?clonazePAM 0.5 MG tablet ?Commonly known as: KLONOPIN ?Take 0.5 mg by mouth daily as needed for anxiety. ?  ?dicyclomine 10 MG capsule ?Commonly known as: BENTYL ?Take 10 mg by mouth every 6 (six) hours as needed for spasms. ?  ?docusate sodium 100 MG capsule ?Commonly known as: COLACE ?Take 1 capsule (100 mg total) by mouth 2 (two) times daily. ?  ?fluticasone 50 MCG/ACT nasal spray ?Commonly known as: FLONASE ?Place 1 spray into both nostrils daily as needed for allergies. ?   ?gabapentin 400 MG capsule ?Commonly known as: NEURONTIN ?Take 400 mg by mouth 3 (three) times daily. ?  ?medroxyPROGESTERone 150 MG/ML injection ?Commonly known as: DEPO-PROVERA ?Inject 150 mg into the muscle every 3 (three) months. ?  ?multivitamin tablet ?Take 1 tablet by mouth daily. ?  ?Neuriva Plus Caps ?Take 1 capsule by mouth daily. ?  ?omeprazole 40 MG capsule ?Commonly known as: PRILOSEC ?Take 40 mg by mouth 2 (two) times daily. ?  ?ondansetron 4 MG tablet ?Commonly known as: ZOFRAN ?Take 1 tablet (4 mg total) by mouth every 6 (six) hours as needed for nausea. ?  ?ondansetron 8 MG disintegrating tablet ?Commonly known as: ZOFRAN-ODT ?Take 8 mg by mouth every 8 (eight) hours as needed for nausea or vomiting. ?  ?oxyCODONE 15 MG immediate release tablet ?Commonly known as: ROXICODONE ?Take 1 tablet (15 mg total) by mouth every 4 (four) hours as needed for severe pain (pain score 7-10). ?  ?polyethylene glycol 17 g packet ?Commonly known as: MIRALAX / GLYCOLAX ?Take 17 g by mouth daily as needed for mild constipation. ?  ?Probiotic/Prebiotic/Cranberry Caps ?Take 1 capsule by mouth daily. ?  ?senna 8.6 MG Tabs tablet ?Commonly known as: SENOKOT ?Take 2 tablets (17.2 mg total) by mouth at bedtime. ?  ?tiZANidine 4 MG tablet ?Commonly known as: ZANAFLEX ?Take 4 mg by mouth every 8 (eight) hours as needed for muscle spasms. ?  ?vitamin E 45 MG (100 UNITS) capsule ?Take by mouth daily. ?  ?zinc gluconate 50 MG tablet ?Take 50 mg by mouth daily. ?  ? ?  ? ?  ?  ? ? ?  ?Durable Medical Equipment  ?(From admission, onward)  ?  ? ? ?  ? ?  Start     Ordered  ? 09/03/21 1230  For home use only DME 3 n 1  Once       ? 09/03/21 1230  ? ?  ?  ? ?  ?  ? ? ?WEIGHT BEARING  ? ?Weight bearing as tolerated with assist device (walker, cane, etc) as directed, use it as long as suggested by your surgeon or therapist, typically at least 4-6 weeks. ? ? ?EXERCISES ? ?Results after joint replacement surgery are often greatly  improved when you follow the exercise, range of motion and muscle strengthening exercises prescribed by your doctor. Safety measures are also important to protect the joint from further injury. Any time any of these exercises cause you to have increased pain or swelling, decrease what you are doing until you are comfortable again and then slowly increase them. If you have problems or questions, call  your caregiver or physical therapist for advice.  ? ?Rehabilitation is important following a joint replacement. After just a few days of immobilization, the muscles of the leg can become weakened and shrink (atrophy).  These exercises are designed to build up the tone and strength of the thigh and leg muscles and to improve motion. Often times heat used for twenty to thirty minutes before working out will loosen up your tissues and help with improving the range of motion but do not use heat for the first two weeks following surgery (sometimes heat can increase post-operative swelling).  ? ?These exercises can be done on a training (exercise) mat, on the floor, on a table or on a bed. Use whatever works the best and is most comfortable for you.    Use music or television while you are exercising so that the exercises are a pleasant break in your day. This will make your life better with the exercises acting as a break in your routine that you can look forward to.   Perform all exercises about fifteen times, three times per day or as directed.  You should exercise both the operative leg and the other leg as well. ? ?Exercises include: ?  ?Quad Sets - Tighten up the muscle on the front of the thigh (Quad) and hold for 5-10 seconds.   ?Straight Leg Raises - With your knee straight (if you were given a brace, keep it on), lift the leg to 60 degrees, hold for 3 seconds, and slowly lower the leg.  Perform this exercise against resistance later as your leg gets stronger.  ?Leg Slides: Lying on your back, slowly slide your foot  toward your buttocks, bending your knee up off the floor (only go as far as is comfortable). Then slowly slide your foot back down until your leg is flat on the floor again.  ?Angel Wings: Lying on your back spre

## 2021-09-03 NOTE — Plan of Care (Signed)
  Problem: Pain Managment: Goal: General experience of comfort will improve Outcome: Progressing   Problem: Safety: Goal: Ability to remain free from injury will improve Outcome: Progressing   

## 2021-09-03 NOTE — Progress Notes (Signed)
? ? ?  Subjective: ? ?Patient reports pain as mild to moderate.  Denies N/V/CP/SOB. No c/o. She stated she felt a little dizzy with physical therapy earlier but is feeling better now and denies dizziness at this time. She said her blood pressure was a little low earlier but they checked it afterwards and it was good.  ? ?Objective:  ? ?VITALS:   ?Vitals:  ? 09/02/21 2054 09/03/21 0110 09/03/21 0624 09/03/21 1002  ?BP: 96/69 106/75 99/68 (!) 84/66  ?Pulse: 65 60 60 72  ?Resp: '17 17 17 20  '$ ?Temp: 98.2 ?F (36.8 ?C) 98.2 ?F (36.8 ?C) 98.3 ?F (36.8 ?C) 98 ?F (36.7 ?C)  ?TempSrc: Oral Oral Oral Oral  ?SpO2: 97% 98% 100% 99%  ?Weight:      ?Height:      ? ? ?NAD ?ABD soft and non tender. 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  ?Neurovascular intact ?Sensation intact distally ?Intact pulses distally ?Dorsiflexion/Plantar flexion intact ?Incision: dressing C/D/I ?No cellulitis present ?Compartment soft  ? ? ?Lab Results  ?Component Value Date  ? WBC 17.3 (H) 09/03/2021  ? HGB 10.0 (L) 09/03/2021  ? HCT 29.8 (L) 09/03/2021  ? MCV 98.3 09/03/2021  ? PLT 260 09/03/2021  ? ?BMET ?   ?Component Value Date/Time  ? NA 135 09/03/2021 0323  ? K 4.2 09/03/2021 0323  ? CL 109 09/03/2021 0323  ? CO2 20 (L) 09/03/2021 0323  ? GLUCOSE 147 (H) 09/03/2021 0323  ? BUN 13 09/03/2021 0323  ? CREATININE 0.65 09/03/2021 0323  ? CALCIUM 8.5 (L) 09/03/2021 0323  ? GFRNONAA >60 09/03/2021 0323  ? ? ? ?Assessment/Plan: ?1 Day Post-Op  ? ?Principal Problem: ?  Knee osteonecrosis, left ([place]) ? ? ?WBAT with walker ?DVT ppx: Eliquis 2.'5mg'$  BID, SCDs, TEDS ?PO pain control ?PT/OT ?Encourage PO fluids ?Dispo: Discharge home with OPPT.  ? ? ?[facility] [place] ?09/03/2021, 1:41 PM ? ? ?Rod Can, MD ?[phone] ?[facility] [employer]  [facility] ?[address], [address], [place], [place] [address] ?Phone: [phone] ?[email or web address] ?Facebook  Engineer, structural  ?  ?  ?

## 2021-09-03 NOTE — TOC Transition Note (Addendum)
Transition of Care (TOC) - CM/SW Discharge Note ? ? ?Patient Details  ?Name: Michelle Hill ?MRN: [id number] ?Date of Birth: 24-Jun-1980 ? ?Transition of Care (TOC) CM/SW Contact:  ?Syrus Nakama, LCSW ?Phone Number: ?09/03/2021, 2:47 PM ? ? ?Clinical Narrative:    ?Met briefly with pt and confirming she has all needed DME at home.  Plan for OPPT at [facility]. No TOC needs. ? ?Addendum:  ?Alerted by PT that pt now reports need for 3n1 commode.  No agency pref.  Referral made to [place] for delivery to pt's room. ? ?Final next level of care: OP Rehab ?Barriers to Discharge: No Barriers Identified ? ? ?Patient Goals and CMS Choice ?Patient states their goals for this hospitalization and ongoing recovery are:: return home ?  ?  ? ?Discharge Placement ?  ?           ?  ?  ?  ?  ? ?Discharge Plan and Services ?  ?  ?           ?DME Arranged: 3-N-1 ?DME Agency: [employer] ?Date DME Agency Contacted: 09/03/21 ?Time DME Agency Contacted: [phone] ?Representative spoke with at DME Agency: Andee Poles ?  ?  ?  ?  ?  ? ?Social Determinants of Health (SDOH) Interventions ?  ? ? ?Readmission Risk Interventions ?   ? View : No data to display.  ?  ?  ?  ? ? ? ? ? ?

## 2021-09-03 NOTE — Progress Notes (Signed)
Physical Therapy Treatment ?Patient Details ?Name: Michelle Hill ?MRN: [id number] ?DOB: [birth year] ?Today's Date: 09/03/2021 ? ? ?History of Present Illness Pt is a 41yo female presenting s/p L-TKA on 09/02/21. PMH: anemia, crohn's disease with colostomy. ? ?  ?PT Comments  ? ? Patient progressing with ambul;ation. Aletha Halim practice stepnext visit. Mildly dizzy, BP 116/56 ?   ?Recommendations for follow up therapy are one component of a multi-disciplinary discharge planning process, led by the attending physician.  Recommendations may be updated based on patient status, additional functional criteria and insurance authorization. ? ?Follow Up Recommendations ? Follow physician's recommendations for discharge plan and follow up therapies ?  ?  ?Assistance Recommended at Discharge Set up Supervision/Assistance  ?Patient can return home with the following A little help with walking and/or transfers;A little help with bathing/dressing/bathroom;Assistance with cooking/housework;Assist for transportation;Help with stairs or ramp for entrance ?  ?Equipment Recommendations ? None recommended by PT  ?  ?Recommendations for Other Services   ? ? ?  ?Precautions / Restrictions Precautions ?Precautions: Fall ?Restrictions ?Weight Bearing Restrictions: No ?LLE Weight Bearing: Weight bearing as tolerated  ?  ? ?Mobility ? Bed Mobility ?  ?Bed Mobility: Supine to Sit ?  ?  ?Supine to sit: Min guard ?  ?  ?  ?  ? ?Transfers ?Overall transfer level: Needs assistance ?Equipment used: Rolling walker (2 wheels) ?Transfers: Sit to/from Stand ?Sit to Stand: Min guard ?  ?  ?  ?  ?  ?General transfer comment: cues for hand and left leg position ?  ? ?Ambulation/Gait ?Ambulation/Gait assistance: Min guard ?Gait Distance (Feet): 40 Feet ?Assistive device: Rolling walker (2 wheels) ?Gait Pattern/deviations: Step-to pattern, Step-through pattern ?  ?  ?  ?  ? ? ?Stairs ?  ?  ?  ?  ?General stair comments: cues for seq ? ? ?Wheelchair Mobility ?   ? ?Modified Rankin (Stroke Patients Only) ?  ? ? ?  ?Balance   ?  ?  ?  ?  ?  ?  ?  ?  ?  ?  ?  ?  ?  ?  ?  ?  ?  ?  ?  ? ?  ?Cognition Arousal/Alertness: Awake/alert ?Behavior During Therapy: [facility] for tasks assessed/performed ?Overall Cognitive Status: Within Functional Limits for tasks assessed ?  ?  ?  ?  ?  ?  ?  ?  ?  ?  ?  ?  ?  ?  ?  ?  ?  ?  ?  ? ?  ?Exercises Total Joint Exercises ?Ankle Circles/Pumps: AROM ?Quad Sets: AROM ?Heel Slides: AAROM, Left, 5 reps ?Hip ABduction/ADduction: AAROM, Left, 5 reps ?Straight Leg Raises: AAROM, Left, 5 reps ? ?  ?General Comments   ?  ?  ? ?Pertinent Vitals/Pain Pain Assessment ?Pain Score: 7  ?Pain Location: L knee ?Pain Descriptors / Indicators: Operative site guarding, Cramping, Discomfort, Grimacing ?Pain Intervention(s): Monitored during session, Premedicated before session, Ice applied  ? ? ?Home Living   ?  ?  ?  ?  ?  ?  ?  ?  ?  ?   ?  ?Prior Function    ?  ?  ?   ? ?PT Goals (current goals can now be found in the care plan section) Progress towards PT goals: Progressing toward goals ? ?  ?Frequency ? ? ? 7X/week ? ? ? ?  ?PT Plan Current plan remains appropriate  ? ? ?Co-evaluation   ?  ?  ?  ?  ? ?  ?  AM-PAC PT "6 Clicks" Mobility   ?Outcome Measure ? Help needed turning from your back to your side while in a flat bed without using bedrails?: A Little ?Help needed moving from lying on your back to sitting on the side of a flat bed without using bedrails?: A Little ?Help needed moving to and from a bed to a chair (including a wheelchair)?: A Little ?Help needed standing up from a chair using your arms (e.g., wheelchair or bedside chair)?: A Little ?Help needed to walk in hospital room?: A Little ?Help needed climbing 3-5 steps with a railing? : A Little ?6 Click Score: 18 ? ?  ?End of Session Equipment Utilized During Treatment: Gait belt ?Activity Tolerance: Patient tolerated treatment well ?Patient left: in chair;with family/visitor present ?Nurse  Communication: Mobility status ?PT Visit Diagnosis: Pain;Difficulty in walking, not elsewhere classified (R26.2) ?Pain - Right/Left: Left ?Pain - part of body: Knee ?  ? ? ?Time: 3748-2707 ?PT Time Calculation (min) (ACUTE ONLY): 31 min ? ?Charges:  $Gait Training: 8-22 mins ?$Therapeutic Exercise: 8-22 mins          ?          ? ?Tresa Endo PT ?Acute Rehabilitation Services ?Pager [phone] ?Office [phone] ? ? ? ?Josee Speece, Shella Maxim ?09/03/2021, 12:42 PM ? ?

## 2021-09-03 NOTE — Progress Notes (Addendum)
Physical Therapy Treatment ?Patient Details ?Name: Michelle Hill ?MRN: [id number] ?DOB: [birth year] ?Today's Date: 09/03/2021 ? ? ?History of Present Illness Pt is a 41yo female presenting s/p L-TKA on 09/02/21. PMH: anemia, crohn's disease with colostomy. ? ?  ?PT Comments  ? ? The patient has met PT goals for DC.  ?Recommend 3 in 1 bedside commode due to pt is confined to a floor of the house without bathroom access.  ?Recommendations for follow up therapy are one component of a multi-disciplinary discharge planning process, led by the attending physician.  Recommendations may be updated based on patient status, additional functional criteria and insurance authorization. ? ?Follow Up Recommendations ? Follow physician's recommendations for discharge plan and follow up therapies ?  ?  ?Assistance Recommended at Discharge Set up Supervision/Assistance  ?Patient can return home with the following A little help with walking and/or transfers;A little help with bathing/dressing/bathroom;Assistance with cooking/housework;Assist for transportation;Help with stairs or ramp for entrance ?  ?Equipment Recommendations ? None recommended by PT  ?  ?Recommendations for Other Services   ? ? ?  ?Precautions / Restrictions Precautions ?Precautions: Fall ?Precaution Comments: Pt reports fall in october 2022 ?Restrictions ?Weight Bearing Restrictions: No ?LLE Weight Bearing: Weight bearing as tolerated  ?  ? ?Mobility ? Bed Mobility ?  ?Bed Mobility: Supine to Sit ?  ?  ?Supine to sit: Min guard ?  ?  ?General bed mobility comments: in recliner ?  ? ?Transfers ?Overall transfer level: Needs assistance ?Equipment used: Rolling walker (2 wheels) ?Transfers: Sit to/from Stand ?Sit to Stand: Min guard ?  ?  ?  ?  ?  ?General transfer comment: cues for hand and left leg position ?  ? ?Ambulation/Gait ?Ambulation/Gait assistance: Min guard ?Gait Distance (Feet): 20 Feet (x 2) ?Assistive device: Rolling walker (2 wheels) ?Gait  Pattern/deviations: Step-to pattern, Step-through pattern ?  ?  ?  ?  ? ? ?Stairs ?Stairs: Yes ?Stairs assistance: Min assist ?Stair Management: No rails, Backwards, With walker ?Number of Stairs: 1 ?General stair comments: mother present  for instruction on sequence ? ? ?Wheelchair Mobility ?  ? ?Modified Rankin (Stroke Patients Only) ?  ? ? ?  ?Balance   ?  ?  ?  ?  ?  ?  ?  ?  ?  ?  ?  ?  ?  ?  ?  ?  ?  ?  ?  ? ?  ?Cognition Arousal/Alertness: Awake/alert ?Behavior During Therapy: [facility] for tasks assessed/performed ?Overall Cognitive Status: Within Functional Limits for tasks assessed ?  ?  ?  ?  ?  ?  ?  ?  ?  ?  ?  ?  ?  ?  ?  ?  ?  ?  ?  ? ?  ?Exercises   ?General Comments   ?  ?  ? ?Pertinent Vitals/Pain Pain Assessment ?Pain Score: 6  ?Pain Location: L knee ?Pain Descriptors / Indicators: Operative site guarding, Cramping, Discomfort, Grimacing ?Pain Intervention(s): Monitored during session, Premedicated before session  ? ? ?Home Living   ?  ?  ?  ?  ?  ?  ?  ?  ?  ?   ?  ?Prior Function    ?  ?  ?   ? ?PT Goals (current goals can now be found in the care plan section) Progress towards PT goals: Progressing toward goals ? ?  ?Frequency ? ? ? 7X/week ? ? ? ?  ?PT Plan Current  plan remains appropriate  ? ? ?Co-evaluation   ?  ?  ?  ?  ? ?  ?AM-PAC PT "6 Clicks" Mobility   ?Outcome Measure ? Help needed turning from your back to your side while in a flat bed without using bedrails?: A Little ?Help needed moving from lying on your back to sitting on the side of a flat bed without using bedrails?: A Little ?Help needed moving to and from a bed to a chair (including a wheelchair)?: A Little ?Help needed standing up from a chair using your arms (e.g., wheelchair or bedside chair)?: A Little ?Help needed to walk in hospital room?: A Little ?Help needed climbing 3-5 steps with a railing? : A Little ?6 Click Score: 18 ? ?  ?End of Session Equipment Utilized During Treatment: Gait belt ?Activity Tolerance: Patient  tolerated treatment well ?Patient left: in chair;with family/visitor present ?Nurse Communication: Mobility status ?PT Visit Diagnosis: Pain;Difficulty in walking, not elsewhere classified (R26.2) ?Pain - Right/Left: Left ?Pain - part of body: Knee ?  ? ? ?Time: 9024-0973 ?PT Time Calculation (min) (ACUTE ONLY): 16 min ? ?Charges:  $Gait Training: 8-22 mins ?         ?          ? ?Tresa Endo PT ?Acute Rehabilitation Services ?Pager [phone] ?Office [phone] ? ? ? ?Felisha Claytor, Shella Maxim ?09/03/2021, 12:48 PM ? ?

## 2021-09-03 NOTE — Progress Notes (Signed)
Discharge package printed and instructions given to patient. Patient verbalizes understanding. 

## 2021-09-04 ENCOUNTER — Encounter (HOSPITAL_COMMUNITY): Payer: Self-pay | Admitting: Orthopedic Surgery

## 2023-07-26 ENCOUNTER — Emergency Department (HOSPITAL_BASED_OUTPATIENT_CLINIC_OR_DEPARTMENT_OTHER)
Admission: EM | Admit: 2023-07-26 | Discharge: 2023-07-26 | Disposition: A | Discharge status: Home / Self Care | Payer: 59 | Attending: Emergency Medicine | Admitting: Emergency Medicine

## 2023-07-26 ENCOUNTER — Other Ambulatory Visit (HOSPITAL_BASED_OUTPATIENT_CLINIC_OR_DEPARTMENT_OTHER): Payer: Self-pay

## 2023-07-26 ENCOUNTER — Encounter (HOSPITAL_BASED_OUTPATIENT_CLINIC_OR_DEPARTMENT_OTHER): Payer: Self-pay

## 2023-07-26 DIAGNOSIS — L0231 Cutaneous abscess of buttock: Secondary | ICD-10-CM | POA: Insufficient documentation

## 2023-07-26 MED ORDER — SULFAMETHOXAZOLE-TRIMETHOPRIM 800-160 MG PO TABS
1.0000 | ORAL_TABLET | Freq: Two times a day (BID) | ORAL | 0 refills | Status: AC
  Filled 2023-07-26: qty 14, 7d supply, fill #0

## 2023-07-26 MED ORDER — LORAZEPAM 1 MG PO TABS
1.0000 mg | ORAL_TABLET | Freq: Once | ORAL | Status: AC
  Administered 2023-07-26: 1 mg via ORAL
  Filled 2023-07-26: qty 1

## 2023-07-26 MED ORDER — HYDROCODONE-ACETAMINOPHEN 5-325 MG PO TABS
1.0000 | ORAL_TABLET | Freq: Four times a day (QID) | ORAL | 0 refills | Status: AC | PRN
Start: 2023-07-26 — End: ?
  Filled 2023-07-26: qty 5, 2d supply, fill #0

## 2023-07-26 MED ORDER — OXYCODONE-ACETAMINOPHEN 5-325 MG PO TABS
1.0000 | ORAL_TABLET | Freq: Once | ORAL | Status: AC
  Administered 2023-07-26: 1 via ORAL
  Filled 2023-07-26: qty 1

## 2023-07-26 MED ORDER — LIDOCAINE-EPINEPHRINE (PF) 2 %-1:200000 IJ SOLN
10.0000 mL | Freq: Once | INTRAMUSCULAR | Status: AC
  Administered 2023-07-26: 10 mL
  Filled 2023-07-26: qty 20

## 2023-07-26 NOTE — ED Notes (Signed)
 D/c paperwork reviewed with pt, including prescriptions and follow up care.  All questions and/or concerns addressed at time of d/c.  No further needs expressed. . Pt verbalized understanding, Ambulatory with family to ED exit, NAD.

## 2023-07-26 NOTE — ED Triage Notes (Signed)
 C/o abscess between buttocks since yesterday. Denies drainage or fever

## 2023-07-26 NOTE — Discharge Instructions (Signed)
 You were seen in the emergency department for an abscess.  We have drained the area and cleaned it. I would like you to have the wound checked in 2-3 days. This can be done by any doctor's office, urgent care, or emergency department. This is to make sure the area hasn't closed too soon. Try to keep the area as clean and dry as possible. It is okay to let warm soapy water run over the area, but do NOT scrub the area.   I am placing you on a course of antibiotics. It is important you finish the entire course! You can take tylenol as needed for pain.  I also given you a prescription for a few doses of Norco given you are allergy to ibuprofen.  Drive or operate heavy machinery while taking this medication as it can be sedating.  This is also a narcotic pain medication and therefore please exercise extreme caution when taking this medication and only take it as prescribed as needed for severe pain only.  You can use an antiseptic (chlorhexidine) soap from the pharmacy 1-2 x per month in the areas where abscesses are most likely to form (armpits, buttocks, groin). This soap can dry your skin out so use it sparingly. Once do so once this area has fully healed.   Continue to monitor how you're doing and return to the ER for new or worsening symptoms.

## 2023-07-26 NOTE — ED Provider Notes (Signed)
  EMERGENCY DEPARTMENT AT [facility] Provider Note   CSN: [id number] Arrival date & time: 07/26/23  4098     History  Chief Complaint  Patient presents with   Abscess    Michelle Hill is a 43 y.o. female.  Patient with history of HS, Crohn's disease presents today with complaints of abscess. She states that same is located in her gluteal cleft in the right buttock. It has been present since Sunday. She has been trying warm compresses without any relief or drainage. Notes that she has had history of same requiring incision and drainage in the past in this same area. She denies any known fevers. No issues with bowel movements.   The history is provided by the patient. No language interpreter was used.  Abscess      Home Medications Prior to Admission medications   Medication Sig Start Date End Date Taking? Authorizing Provider  acetaminophen (TYLENOL) 650 MG CR tablet Take 1,300 mg by mouth every 8 (eight) hours as needed for pain.    [provider]  albuterol (VENTOLIN HFA) 108 (90 Base) MCG/ACT inhaler Inhale 1-2 puffs into the lungs every 6 (six) hours as needed for wheezing or shortness of breath.    [provider]  apixaban (ELIQUIS) 2.5 MG TABS tablet Take 1 tablet (2.5 mg total) by mouth every 12 (twelve) hours. 09/03/21 10/03/21  Swinteck, Arlys John, MD  atorvastatin (LIPITOR) 20 MG tablet Take 20 mg by mouth at bedtime. 02/13/21   [provider]  AZO CRANBERRY GUMMIES PO Take 1 capsule by mouth 2 (two) times daily.    [provider]  buPROPion (WELLBUTRIN XL) 150 MG 24 hr tablet Take 150 mg by mouth daily.    [provider]  Calcium Carbonate-Vitamin D (CALTRATE 600+D PO) Take 1 tablet by mouth daily.    [provider]  cetirizine (ZYRTEC) 10 MG tablet Take 10 mg by mouth daily as needed for allergies.    [provider]  clonazePAM (KLONOPIN) 0.5 MG tablet Take 0.5 mg by mouth daily as  needed for anxiety. 02/26/21   [provider]  Cobalamin Combinations (NEURIVA PLUS) CAPS Take 1 capsule by mouth daily.    [provider]  dicyclomine (BENTYL) 10 MG capsule Take 10 mg by mouth every 6 (six) hours as needed for spasms. 02/11/21   [provider]  fluticasone (FLONASE) 50 MCG/ACT nasal spray Place 1 spray into both nostrils daily as needed for allergies. 03/17/21   [provider]  gabapentin (NEURONTIN) 400 MG capsule Take 400 mg by mouth 3 (three) times daily. 01/26/21   [provider]  medroxyPROGESTERone (DEPO-PROVERA) 150 MG/ML injection Inject 150 mg into the muscle every 3 (three) months.    [provider]  Multiple Vitamin (MULTIVITAMIN) tablet Take 1 tablet by mouth daily.    [provider]  omeprazole (PRILOSEC) 40 MG capsule Take 40 mg by mouth 2 (two) times daily. 02/11/21   [provider]  ondansetron (ZOFRAN) 4 MG tablet Take 1 tablet (4 mg total) by mouth every 6 (six) hours as needed for nausea. 09/03/21   Swinteck, Arlys John, MD  ondansetron (ZOFRAN-ODT) 8 MG disintegrating tablet Take 8 mg by mouth every 8 (eight) hours as needed for nausea or vomiting.    [provider]  oxyCODONE (ROXICODONE) 15 MG immediate release tablet Take 1 tablet (15 mg total) by mouth every 4 (four) hours as needed for severe pain (pain score 7-10). 09/03/21  Swinteck, Arlys John, MD  polyethylene glycol (MIRALAX / GLYCOLAX) 17 g packet Take 17 g by mouth daily as needed for mild constipation. 09/03/21   Swinteck, Arlys John, MD  Probiotic Product (PROBIOTIC/PREBIOTIC/CRANBERRY) CAPS Take 1 capsule by mouth daily.    [provider]  tiZANidine (ZANAFLEX) 4 MG tablet Take 4 mg by mouth every 8 (eight) hours as needed for muscle spasms.    [provider]  vitamin E 45 MG (100 UNITS) capsule Take by mouth daily.    [provider]  zinc gluconate 50 MG tablet Take 50 mg by mouth daily.    [provider]      Allergies    Chocolate, Aspirin, Ibuprofen, Escitalopram oxalate, and Tramadol    Review of Systems   Review of Systems  All other systems reviewed and are negative.   Physical Exam Updated Vital Signs BP (!) 114/90 (BP Location: Right Arm)   Pulse 88   Temp 98.1 F (36.7 C)   Resp 16   Ht 5\' 6"  (1.676 m)   Wt 81.6 kg   SpO2 99%   BMI 29.05 kg/m  Physical Exam Vitals and nursing note reviewed.  Constitutional:      General: She is not in acute distress.    Appearance: Normal appearance. She is normal weight. She is not ill-appearing, toxic-appearing or diaphoretic.  HENT:     Head: Normocephalic and atraumatic.  Cardiovascular:     Rate and Rhythm: Normal rate.  Pulmonary:     Effort: Pulmonary effort is normal. No respiratory distress.  Abdominal:     General: Abdomen is flat.     Palpations: Abdomen is soft.     Tenderness: There is no abdominal tenderness.     Comments: Illeostomy bag in place  Genitourinary:    Comments: 1 cm x 1 cm area of fluctuance with surrounding induration noted to the right buttock within the gluteal cleft. Area is not draining. Appears superficial in nature without signs of deep tracking infection. Tenderness noted to palpation. Musculoskeletal:        General: Normal range of motion.     Cervical back: Normal range of motion.  Skin:    General: Skin is warm and dry.  Neurological:     General: No focal deficit present.     Mental Status: She is alert.  Psychiatric:        Mood and Affect: Mood normal.        Behavior: Behavior normal.     ED Results / Procedures / Treatments   Labs (all labs ordered are listed, but only abnormal results are displayed) Labs Reviewed - No data to display  EKG None  Radiology No results found.  Procedures .Incision and Drainage  Date/Time: 07/26/2023 3:18 PM  Performed by: Silva Bandy, PA-C Authorized by: Silva Bandy, PA-C   Consent:    Consent obtained:   Verbal   Consent given by:  Patient   Risks, benefits, and alternatives were discussed: yes     Risks discussed:  Bleeding, damage to other organs, infection, incomplete drainage and pain   Alternatives discussed:  No treatment, delayed treatment, alternative treatment, observation and referral Universal protocol:    Procedure explained and questions answered to patient or proxy's satisfaction: yes     Patient identity confirmed:  Verbally with patient Location:    Type:  Abscess   Size:  1 cm x 1 cm   Location:  Anogenital   Anogenital location: right  buttock. Pre-procedure details:    Skin preparation:  Povidone-iodine Sedation:    Sedation type:  Anxiolysis Anesthesia:    Anesthesia method:  Local infiltration   Local anesthetic:  Lidocaine 2% WITH epi Procedure type:    Complexity:  Simple Procedure details:    Incision types:  Cruciate   Wound management:  Probed and deloculated   Drainage:  Purulent   Drainage amount:  Moderate   Wound treatment:  Wound left open   Packing materials:  None Post-procedure details:    Procedure completion:  Tolerated well, no immediate complications     Medications Ordered in ED Medications  oxyCODONE-acetaminophen (PERCOCET/ROXICET) 5-325 MG per tablet 1 tablet (1 tablet Oral Given 07/26/23 1334)  LORazepam (ATIVAN) tablet 1 mg (1 mg Oral Given 07/26/23 1334)  lidocaine-EPINEPHrine (XYLOCAINE W/EPI) 2 %-1:200000 (PF) injection 10 mL (10 mLs Infiltration Given by Other 07/26/23 1338)    ED Course/ Medical Decision Making/ A&P                                 Medical Decision Making Risk Prescription drug management.   Patient is a 43 y.o. female who presents to the emergency department for an right buttock abscess.  Physical exam: 1 cm x 1 cm area of fluctuance with surrounding induration noted in the right buttock area. No active drainage. Area appears very superficial  Procedure: Abscess was incised and drained per above  procedure. Area was anesthestized with lidocaine with epinephrine.  Good purulent fluid output appears fully drained.  The area was extremely superficial and did not have any signs of a deep tracking infection.  Also given Ativan for anxiolysis. Patient tolerated procedure well with no immediate complications.   Disposition: Patient is not requiring admission or inpatient treatment further symptoms.  Patient was placed on course of bactrim as she states this is normally what she is given when she has abscesses drained. Instructed to have a wound check in 3 days.  Will also send for a few doses of Norco for pain management per her request given she has an allergy to NSAIDs.  PDMP reviewed.  Patient advised not to drive or operate heavy machinery while taking this medication.  Evaluation and diagnostic testing in the emergency department does not suggest an emergent condition requiring admission or immediate intervention beyond what has been performed at this time.  Plan for discharge with close PCP follow-up.  Patient is understanding and amenable with plan, educated on red flag symptoms that would prompt immediate return.  Patient discharged in stable condition.  Final Clinical Impression(s) / ED Diagnoses Final diagnoses:  Abscess of buttock, right    Rx / DC Orders ED Discharge Orders          Ordered    sulfamethoxazole-trimethoprim (BACTRIM DS) 800-160 MG tablet  2 times daily        07/26/23 1521    HYDROcodone-acetaminophen (NORCO/VICODIN) 5-325 MG tablet  Every 6 hours PRN        07/26/23 1521          An After Visit Summary was printed and given to the patient.     Vear Clock 07/26/23 1527    Ernie Avena, MD 07/26/23 [phone]

## 2024-02-20 ENCOUNTER — Other Ambulatory Visit (HOSPITAL_BASED_OUTPATIENT_CLINIC_OR_DEPARTMENT_OTHER): Payer: Self-pay

## 2024-02-20 MED ORDER — COMIRNATY 30 MCG/0.3ML IM SUSY
0.3000 mL | PREFILLED_SYRINGE | Freq: Once | INTRAMUSCULAR | 0 refills | Status: AC
  Filled 2024-02-20: qty 0.3, 1d supply, fill #0

## 2024-02-20 MED ORDER — FLUZONE 0.5 ML IM SUSY
0.5000 mL | PREFILLED_SYRINGE | Freq: Once | INTRAMUSCULAR | 0 refills | Status: AC
Start: 2024-02-20 — End: 2024-02-21
  Filled 2024-02-20: qty 0.5, 1d supply, fill #0
